# Patient Record
Sex: Female | Born: 1935 | Race: Black or African American | Hispanic: No | Smoking: Never smoker
Health system: Southern US, Community
[De-identification: ages and names within clinical notes are randomized; demographics above are authoritative.]

## PROBLEM LIST (undated history)

## (undated) DIAGNOSIS — R7303 Prediabetes: Secondary | ICD-10-CM

## (undated) DIAGNOSIS — D352 Benign neoplasm of pituitary gland: Secondary | ICD-10-CM

## (undated) DIAGNOSIS — D649 Anemia, unspecified: Secondary | ICD-10-CM

## (undated) DIAGNOSIS — M1990 Unspecified osteoarthritis, unspecified site: Secondary | ICD-10-CM

## (undated) DIAGNOSIS — E079 Disorder of thyroid, unspecified: Secondary | ICD-10-CM

## (undated) DIAGNOSIS — C50919 Malignant neoplasm of unspecified site of unspecified female breast: Secondary | ICD-10-CM

## (undated) DIAGNOSIS — E039 Hypothyroidism, unspecified: Secondary | ICD-10-CM

## (undated) HISTORY — PX: CATARACT EXTRACTION: SUR2

## (undated) HISTORY — PX: DILATION AND CURETTAGE OF UTERUS: SHX78

## (undated) HISTORY — PX: APPENDECTOMY: SHX54

## (undated) HISTORY — DX: Disorder of thyroid, unspecified: E07.9

## (undated) HISTORY — PX: EYE SURGERY: SHX253

## (undated) HISTORY — PX: HAMMER TOE SURGERY: SHX385

## (undated) HISTORY — PX: TUBAL LIGATION: SHX77

## (undated) HISTORY — DX: Malignant neoplasm of unspecified site of unspecified female breast: C50.919

## (undated) HISTORY — DX: Benign neoplasm of pituitary gland: D35.2

## (undated) HISTORY — DX: Unspecified osteoarthritis, unspecified site: M19.90

## (undated) HISTORY — PX: OTHER SURGICAL HISTORY: SHX169

---

## 2003-03-15 DIAGNOSIS — H409 Unspecified glaucoma: Secondary | ICD-10-CM | POA: Insufficient documentation

## 2003-05-31 ENCOUNTER — Other Ambulatory Visit: Admission: RE | Admit: 2003-05-31 | Discharge: 2003-05-31 | Payer: Self-pay | Admitting: *Deleted

## 2004-09-13 ENCOUNTER — Encounter: Admission: RE | Admit: 2004-09-13 | Discharge: 2004-09-13 | Payer: Self-pay | Admitting: Obstetrics and Gynecology

## 2005-07-16 ENCOUNTER — Other Ambulatory Visit: Admission: RE | Admit: 2005-07-16 | Discharge: 2005-07-16 | Payer: Self-pay | Admitting: Obstetrics and Gynecology

## 2005-09-16 ENCOUNTER — Encounter: Admission: RE | Admit: 2005-09-16 | Discharge: 2005-09-16 | Payer: Self-pay | Admitting: Obstetrics and Gynecology

## 2006-09-21 ENCOUNTER — Encounter: Admission: RE | Admit: 2006-09-21 | Discharge: 2006-09-21 | Payer: Self-pay | Admitting: Obstetrics and Gynecology

## 2006-10-01 ENCOUNTER — Encounter: Admission: RE | Admit: 2006-10-01 | Discharge: 2006-10-01 | Payer: Self-pay | Admitting: Obstetrics and Gynecology

## 2007-10-22 ENCOUNTER — Encounter: Admission: RE | Admit: 2007-10-22 | Discharge: 2007-10-22 | Payer: Self-pay | Admitting: Obstetrics and Gynecology

## 2008-08-24 ENCOUNTER — Encounter: Admission: RE | Admit: 2008-08-24 | Discharge: 2008-08-24 | Payer: Self-pay | Admitting: Obstetrics and Gynecology

## 2008-10-24 ENCOUNTER — Encounter: Admission: RE | Admit: 2008-10-24 | Discharge: 2008-10-24 | Payer: Self-pay | Admitting: Obstetrics and Gynecology

## 2009-10-30 ENCOUNTER — Encounter: Admission: RE | Admit: 2009-10-30 | Discharge: 2009-10-30 | Payer: Self-pay | Admitting: Obstetrics and Gynecology

## 2010-08-04 ENCOUNTER — Encounter: Payer: Self-pay | Admitting: Obstetrics and Gynecology

## 2010-10-01 ENCOUNTER — Other Ambulatory Visit (HOSPITAL_BASED_OUTPATIENT_CLINIC_OR_DEPARTMENT_OTHER): Payer: Self-pay | Admitting: Family Medicine

## 2010-10-01 DIAGNOSIS — Z1239 Encounter for other screening for malignant neoplasm of breast: Secondary | ICD-10-CM

## 2010-11-01 ENCOUNTER — Ambulatory Visit (HOSPITAL_BASED_OUTPATIENT_CLINIC_OR_DEPARTMENT_OTHER): Payer: Self-pay

## 2010-11-01 ENCOUNTER — Ambulatory Visit (HOSPITAL_BASED_OUTPATIENT_CLINIC_OR_DEPARTMENT_OTHER)
Admission: RE | Admit: 2010-11-01 | Discharge: 2010-11-01 | Disposition: A | Discharge status: Home / Self Care | Payer: Medicare Other | Source: Ambulatory Visit | Attending: Family Medicine | Admitting: Family Medicine

## 2010-11-01 DIAGNOSIS — Z1239 Encounter for other screening for malignant neoplasm of breast: Secondary | ICD-10-CM

## 2010-11-01 DIAGNOSIS — Z1231 Encounter for screening mammogram for malignant neoplasm of breast: Secondary | ICD-10-CM | POA: Insufficient documentation

## 2011-03-25 ENCOUNTER — Ambulatory Visit: Payer: Medicare Other | Attending: Orthopedic Surgery | Admitting: Physical Therapy

## 2011-03-25 DIAGNOSIS — M545 Low back pain, unspecified: Secondary | ICD-10-CM | POA: Insufficient documentation

## 2011-03-25 DIAGNOSIS — IMO0001 Reserved for inherently not codable concepts without codable children: Secondary | ICD-10-CM | POA: Insufficient documentation

## 2011-04-01 ENCOUNTER — Ambulatory Visit: Payer: Medicare Other | Admitting: Physical Therapy

## 2011-04-03 ENCOUNTER — Ambulatory Visit: Payer: Medicare Other | Admitting: Physical Therapy

## 2011-04-09 ENCOUNTER — Ambulatory Visit: Payer: Medicare Other | Admitting: Physical Therapy

## 2011-04-14 ENCOUNTER — Ambulatory Visit: Payer: Medicare Other | Attending: Orthopedic Surgery | Admitting: Physical Therapy

## 2011-04-14 DIAGNOSIS — IMO0001 Reserved for inherently not codable concepts without codable children: Secondary | ICD-10-CM | POA: Insufficient documentation

## 2011-04-14 DIAGNOSIS — M545 Low back pain, unspecified: Secondary | ICD-10-CM | POA: Insufficient documentation

## 2011-04-16 ENCOUNTER — Ambulatory Visit: Payer: Medicare Other | Admitting: Physical Therapy

## 2011-04-21 ENCOUNTER — Ambulatory Visit: Payer: Medicare Other | Admitting: Physical Therapy

## 2011-04-23 ENCOUNTER — Ambulatory Visit: Payer: Medicare Other | Admitting: Physical Therapy

## 2011-09-23 ENCOUNTER — Other Ambulatory Visit (HOSPITAL_BASED_OUTPATIENT_CLINIC_OR_DEPARTMENT_OTHER): Payer: Self-pay | Admitting: Internal Medicine

## 2011-09-23 DIAGNOSIS — Z1231 Encounter for screening mammogram for malignant neoplasm of breast: Secondary | ICD-10-CM

## 2011-11-04 ENCOUNTER — Ambulatory Visit (HOSPITAL_BASED_OUTPATIENT_CLINIC_OR_DEPARTMENT_OTHER)
Admission: RE | Admit: 2011-11-04 | Discharge: 2011-11-04 | Disposition: A | Discharge status: Home / Self Care | Payer: Medicare Other | Source: Ambulatory Visit | Attending: Internal Medicine | Admitting: Internal Medicine

## 2011-11-04 DIAGNOSIS — Z1231 Encounter for screening mammogram for malignant neoplasm of breast: Secondary | ICD-10-CM

## 2012-08-09 ENCOUNTER — Other Ambulatory Visit: Payer: Self-pay | Admitting: Otolaryngology

## 2012-08-09 DIAGNOSIS — J3489 Other specified disorders of nose and nasal sinuses: Secondary | ICD-10-CM

## 2012-08-09 DIAGNOSIS — J329 Chronic sinusitis, unspecified: Secondary | ICD-10-CM

## 2012-08-09 DIAGNOSIS — R209 Unspecified disturbances of skin sensation: Secondary | ICD-10-CM

## 2012-08-10 ENCOUNTER — Ambulatory Visit
Admission: RE | Admit: 2012-08-10 | Discharge: 2012-08-10 | Disposition: A | Discharge status: Home / Self Care | Payer: Medicare Other | Source: Ambulatory Visit | Attending: Otolaryngology | Admitting: Otolaryngology

## 2012-08-10 DIAGNOSIS — J3489 Other specified disorders of nose and nasal sinuses: Secondary | ICD-10-CM

## 2012-08-10 DIAGNOSIS — J329 Chronic sinusitis, unspecified: Secondary | ICD-10-CM

## 2012-08-10 DIAGNOSIS — R209 Unspecified disturbances of skin sensation: Secondary | ICD-10-CM

## 2012-10-05 ENCOUNTER — Ambulatory Visit (INDEPENDENT_AMBULATORY_CARE_PROVIDER_SITE_OTHER): Payer: Medicare Other | Admitting: Neurology

## 2012-10-05 ENCOUNTER — Encounter: Payer: Self-pay | Admitting: Neurology

## 2012-10-05 VITALS — BP 135/77 | HR 77 | Ht 64.0 in | Wt 190.0 lb

## 2012-10-05 DIAGNOSIS — R519 Headache, unspecified: Secondary | ICD-10-CM

## 2012-10-05 DIAGNOSIS — D352 Benign neoplasm of pituitary gland: Secondary | ICD-10-CM

## 2012-10-05 DIAGNOSIS — R51 Headache: Secondary | ICD-10-CM

## 2012-10-05 HISTORY — DX: Benign neoplasm of pituitary gland: D35.2

## 2012-10-05 NOTE — Patient Instructions (Addendum)
Call office in March 2015,before next appointment for MRI pituitary w/wo contrast.

## 2012-10-05 NOTE — Progress Notes (Signed)
HPI: [name] is a 78 years old right-handed African American female, referred by her primary care physician Dr. Tiburcio Pea, ENT Dr. Melvenia Beam for evaluation of left facial pain  She had a past medical history of chickenpox, also had a history of shingles vaccination 10 years ago, in December 2013, she went to the funeral of her sister at [place], staying outside in [place] weather for a few hours, often worse, she began to notice left facial deep achy pain, involving left lower eyelid, left cheek region, especially left nasal labial fold region, left upper lip, there was no rash , severe pain last about few weeks, she began to notice hyposensitivity in the similar region, also  stuffiness on her left nose,  She was treated with a few runs of antibiotics, nasal endoscopy examination by Dr. Emeline Darling was normal, CT sinus was normal as well  UPDATE March 25th 2014: MRI brain showed hypo-enhancing lesion in the left pituitary gland (10x13mm on axial views). Likely represents pituitary microadenoma. Mild perisylvian atrophy.  No abnormalities of the trigeminal nerve Her left facial stinging pain sensation has disappeared, gabapentin 100 mg 3 times a day help her some, she has numbness involving left cheek, left upper lip, left nasal labia fold, she denies visual difficulty, no swallowing difficulty, no dysarthria she denied gait difficulty,  Review of Systems  Out of a complete 14 system review, the patient complains of only the following symptoms, and all other reviewed systems are negative.  Neurological: Numbness       Cancer Heart disease   Physical Exam  Neck: supple no carotid bruits Respiratory: clear to auscultation bilaterally Cardiovascular: regular rate rhythm  Neurologic Exam  Mental Status: pleasant, awake, alert, cooperative to history, talking, and casual conversation. Cranial Nerves: CN II-XII pupils were equal round reactive to light.  Fundi were sharp bilaterally.  Extraocular  movements were full.  Visual fields were full on confrontational test.  Facial sensation and strength were normal.  Hearing was intact to finger rubbing bilaterally.  Uvula tongue were midline.  Head turning and shoulder shrugging were normal and symmetric.  Tongue protrusion into the cheeks strength were normal.  Motor: Normal tone, bulk, and strength. Sensory: Normal to light touch, pinprick, proprioception, and vibratory sensation.with the exception of hypersensitivity at left V2 distribution, involving left upper lip, left nasal area, bilateral corneal reflexes were normal and symmetric Coordination: Normal finger-to-nose, heel-to-shin.  There was no dysmetria noticed. Gait and Station: Narrow based and steady, was able to perform tiptoe, heel, and tandem walking without difficulty.  Romberg sign: Negative Reflexes: Deep tendon reflexes: Biceps: 2/2, Brachioradialis: 2/2, Triceps: 2/2, Pateller: 2/2, Achilles: 2/2.  Plantar responses are flexor.   Assessment and Plan: 78 years old [place] American female, with past medical history of chickenpox, previously shingles vaccination, presenting with left facial pain, hypersensitivity, in the territory of left V2 distribution,   1, most likely mild form of  left V2 shingle 2. Microadenoma of pituitary gland on MRI, 3 check TSH 4.Return to clinic with Eber Jones in one year

## 2012-10-06 ENCOUNTER — Other Ambulatory Visit: Payer: Self-pay | Admitting: Obstetrics and Gynecology

## 2012-10-06 DIAGNOSIS — Z803 Family history of malignant neoplasm of breast: Secondary | ICD-10-CM

## 2012-10-06 LAB — TSH: TSH: 3.04 u[IU]/mL (ref 0.450–4.500)

## 2012-10-07 ENCOUNTER — Other Ambulatory Visit (HOSPITAL_BASED_OUTPATIENT_CLINIC_OR_DEPARTMENT_OTHER): Payer: Self-pay | Admitting: Family Medicine

## 2012-10-07 DIAGNOSIS — Z1239 Encounter for other screening for malignant neoplasm of breast: Secondary | ICD-10-CM

## 2012-10-14 ENCOUNTER — Telehealth: Payer: Self-pay

## 2012-10-14 NOTE — Telephone Encounter (Signed)
Called patient and left her a message with Normal labs.

## 2012-11-05 ENCOUNTER — Ambulatory Visit (HOSPITAL_BASED_OUTPATIENT_CLINIC_OR_DEPARTMENT_OTHER)
Admission: RE | Admit: 2012-11-05 | Discharge: 2012-11-05 | Disposition: A | Discharge status: Home / Self Care | Payer: Medicare Other | Source: Ambulatory Visit | Attending: Family Medicine | Admitting: Family Medicine

## 2012-11-05 DIAGNOSIS — Z1231 Encounter for screening mammogram for malignant neoplasm of breast: Secondary | ICD-10-CM | POA: Insufficient documentation

## 2012-11-05 DIAGNOSIS — Z1239 Encounter for other screening for malignant neoplasm of breast: Secondary | ICD-10-CM

## 2013-10-03 ENCOUNTER — Other Ambulatory Visit (HOSPITAL_BASED_OUTPATIENT_CLINIC_OR_DEPARTMENT_OTHER): Payer: Self-pay | Admitting: Family Medicine

## 2013-10-03 DIAGNOSIS — Z1231 Encounter for screening mammogram for malignant neoplasm of breast: Secondary | ICD-10-CM

## 2013-11-14 ENCOUNTER — Ambulatory Visit (HOSPITAL_BASED_OUTPATIENT_CLINIC_OR_DEPARTMENT_OTHER): Payer: Medicare Other

## 2013-11-18 ENCOUNTER — Ambulatory Visit (HOSPITAL_BASED_OUTPATIENT_CLINIC_OR_DEPARTMENT_OTHER)
Admission: RE | Admit: 2013-11-18 | Discharge: 2013-11-18 | Disposition: A | Discharge status: Home / Self Care | Payer: Medicare Other | Source: Ambulatory Visit | Attending: Family Medicine | Admitting: Family Medicine

## 2013-11-18 DIAGNOSIS — Z1231 Encounter for screening mammogram for malignant neoplasm of breast: Secondary | ICD-10-CM | POA: Insufficient documentation

## 2014-10-16 ENCOUNTER — Other Ambulatory Visit (HOSPITAL_BASED_OUTPATIENT_CLINIC_OR_DEPARTMENT_OTHER): Payer: Self-pay | Admitting: Family Medicine

## 2014-10-16 DIAGNOSIS — Z1231 Encounter for screening mammogram for malignant neoplasm of breast: Secondary | ICD-10-CM

## 2014-11-24 ENCOUNTER — Ambulatory Visit (HOSPITAL_BASED_OUTPATIENT_CLINIC_OR_DEPARTMENT_OTHER)
Admission: RE | Admit: 2014-11-24 | Discharge: 2014-11-24 | Disposition: A | Discharge status: Home / Self Care | Payer: Medicare Other | Source: Ambulatory Visit | Attending: Family Medicine | Admitting: Family Medicine

## 2014-11-24 DIAGNOSIS — Z1231 Encounter for screening mammogram for malignant neoplasm of breast: Secondary | ICD-10-CM | POA: Diagnosis not present

## 2015-10-24 ENCOUNTER — Other Ambulatory Visit (HOSPITAL_BASED_OUTPATIENT_CLINIC_OR_DEPARTMENT_OTHER): Payer: Self-pay | Admitting: Family Medicine

## 2015-10-24 DIAGNOSIS — Z1231 Encounter for screening mammogram for malignant neoplasm of breast: Secondary | ICD-10-CM

## 2015-10-30 DIAGNOSIS — M2041 Other hammer toe(s) (acquired), right foot: Secondary | ICD-10-CM | POA: Insufficient documentation

## 2015-11-26 ENCOUNTER — Ambulatory Visit (HOSPITAL_BASED_OUTPATIENT_CLINIC_OR_DEPARTMENT_OTHER)
Admission: RE | Admit: 2015-11-26 | Discharge: 2015-11-26 | Disposition: A | Discharge status: Home / Self Care | Payer: Medicare Other | Source: Ambulatory Visit | Attending: Family Medicine | Admitting: Family Medicine

## 2015-11-26 DIAGNOSIS — Z1231 Encounter for screening mammogram for malignant neoplasm of breast: Secondary | ICD-10-CM | POA: Insufficient documentation

## 2016-10-20 ENCOUNTER — Other Ambulatory Visit (HOSPITAL_BASED_OUTPATIENT_CLINIC_OR_DEPARTMENT_OTHER): Payer: Self-pay | Admitting: Family Medicine

## 2016-10-20 DIAGNOSIS — Z1231 Encounter for screening mammogram for malignant neoplasm of breast: Secondary | ICD-10-CM

## 2016-11-27 ENCOUNTER — Ambulatory Visit (HOSPITAL_BASED_OUTPATIENT_CLINIC_OR_DEPARTMENT_OTHER)
Admission: RE | Admit: 2016-11-27 | Discharge: 2016-11-27 | Disposition: A | Discharge status: Home / Self Care | Payer: Medicare Other | Source: Ambulatory Visit | Attending: Family Medicine | Admitting: Family Medicine

## 2016-11-27 DIAGNOSIS — Z1231 Encounter for screening mammogram for malignant neoplasm of breast: Secondary | ICD-10-CM | POA: Diagnosis present

## 2017-10-19 ENCOUNTER — Other Ambulatory Visit (HOSPITAL_BASED_OUTPATIENT_CLINIC_OR_DEPARTMENT_OTHER): Payer: Self-pay | Admitting: Family Medicine

## 2017-10-19 DIAGNOSIS — Z1231 Encounter for screening mammogram for malignant neoplasm of breast: Secondary | ICD-10-CM

## 2017-11-27 ENCOUNTER — Ambulatory Visit (HOSPITAL_BASED_OUTPATIENT_CLINIC_OR_DEPARTMENT_OTHER): Payer: Federal, State, Local not specified - PPO

## 2017-11-27 ENCOUNTER — Ambulatory Visit (HOSPITAL_BASED_OUTPATIENT_CLINIC_OR_DEPARTMENT_OTHER)
Admission: RE | Admit: 2017-11-27 | Discharge: 2017-11-27 | Disposition: A | Discharge status: Home / Self Care | Payer: Medicare Other | Source: Ambulatory Visit | Attending: Family Medicine | Admitting: Family Medicine

## 2017-11-27 DIAGNOSIS — Z1231 Encounter for screening mammogram for malignant neoplasm of breast: Secondary | ICD-10-CM | POA: Diagnosis present

## 2018-10-18 ENCOUNTER — Other Ambulatory Visit (HOSPITAL_BASED_OUTPATIENT_CLINIC_OR_DEPARTMENT_OTHER): Payer: Self-pay | Admitting: Family Medicine

## 2018-10-18 DIAGNOSIS — Z1231 Encounter for screening mammogram for malignant neoplasm of breast: Secondary | ICD-10-CM

## 2018-11-30 ENCOUNTER — Ambulatory Visit (HOSPITAL_BASED_OUTPATIENT_CLINIC_OR_DEPARTMENT_OTHER)
Admission: RE | Admit: 2018-11-30 | Discharge: 2018-11-30 | Disposition: A | Discharge status: Home / Self Care | Payer: Medicare Other | Source: Ambulatory Visit | Attending: Family Medicine | Admitting: Family Medicine

## 2018-11-30 ENCOUNTER — Ambulatory Visit (HOSPITAL_BASED_OUTPATIENT_CLINIC_OR_DEPARTMENT_OTHER): Payer: Federal, State, Local not specified - PPO

## 2018-11-30 ENCOUNTER — Other Ambulatory Visit: Payer: Self-pay

## 2018-11-30 DIAGNOSIS — Z1231 Encounter for screening mammogram for malignant neoplasm of breast: Secondary | ICD-10-CM | POA: Insufficient documentation

## 2019-11-07 ENCOUNTER — Other Ambulatory Visit (HOSPITAL_BASED_OUTPATIENT_CLINIC_OR_DEPARTMENT_OTHER): Payer: Self-pay | Admitting: Family Medicine

## 2019-11-07 DIAGNOSIS — Z1231 Encounter for screening mammogram for malignant neoplasm of breast: Secondary | ICD-10-CM

## 2019-12-02 ENCOUNTER — Ambulatory Visit (HOSPITAL_BASED_OUTPATIENT_CLINIC_OR_DEPARTMENT_OTHER): Payer: Federal, State, Local not specified - PPO

## 2019-12-05 ENCOUNTER — Ambulatory Visit (HOSPITAL_BASED_OUTPATIENT_CLINIC_OR_DEPARTMENT_OTHER)
Admission: RE | Admit: 2019-12-05 | Discharge: 2019-12-05 | Disposition: A | Discharge status: Home / Self Care | Payer: Medicare Other | Source: Ambulatory Visit | Attending: Family Medicine | Admitting: Family Medicine

## 2019-12-05 ENCOUNTER — Other Ambulatory Visit: Payer: Self-pay

## 2019-12-05 DIAGNOSIS — Z1231 Encounter for screening mammogram for malignant neoplasm of breast: Secondary | ICD-10-CM | POA: Diagnosis present

## 2020-06-14 DIAGNOSIS — M76821 Posterior tibial tendinitis, right leg: Secondary | ICD-10-CM | POA: Insufficient documentation

## 2020-09-11 DIAGNOSIS — M069 Rheumatoid arthritis, unspecified: Secondary | ICD-10-CM | POA: Insufficient documentation

## 2020-11-26 ENCOUNTER — Other Ambulatory Visit (HOSPITAL_BASED_OUTPATIENT_CLINIC_OR_DEPARTMENT_OTHER): Payer: Self-pay | Admitting: Family Medicine

## 2020-11-26 DIAGNOSIS — Z1231 Encounter for screening mammogram for malignant neoplasm of breast: Secondary | ICD-10-CM

## 2020-12-11 ENCOUNTER — Ambulatory Visit (HOSPITAL_BASED_OUTPATIENT_CLINIC_OR_DEPARTMENT_OTHER): Payer: Federal, State, Local not specified - PPO

## 2020-12-11 ENCOUNTER — Encounter (HOSPITAL_BASED_OUTPATIENT_CLINIC_OR_DEPARTMENT_OTHER): Payer: Self-pay

## 2020-12-11 ENCOUNTER — Other Ambulatory Visit: Payer: Self-pay

## 2020-12-11 ENCOUNTER — Ambulatory Visit (HOSPITAL_BASED_OUTPATIENT_CLINIC_OR_DEPARTMENT_OTHER)
Admission: RE | Admit: 2020-12-11 | Discharge: 2020-12-11 | Disposition: A | Discharge status: Home / Self Care | Payer: Medicare Other | Source: Ambulatory Visit | Attending: Family Medicine | Admitting: Family Medicine

## 2020-12-11 DIAGNOSIS — Z1231 Encounter for screening mammogram for malignant neoplasm of breast: Secondary | ICD-10-CM | POA: Insufficient documentation

## 2020-12-12 ENCOUNTER — Other Ambulatory Visit: Payer: Self-pay | Admitting: Family Medicine

## 2020-12-12 DIAGNOSIS — R928 Other abnormal and inconclusive findings on diagnostic imaging of breast: Secondary | ICD-10-CM

## 2021-01-03 ENCOUNTER — Other Ambulatory Visit: Payer: Self-pay

## 2021-01-03 ENCOUNTER — Ambulatory Visit
Admission: RE | Admit: 2021-01-03 | Discharge: 2021-01-03 | Disposition: A | Discharge status: Home / Self Care | Payer: Medicare Other | Source: Ambulatory Visit | Attending: Family Medicine | Admitting: Family Medicine

## 2021-01-03 ENCOUNTER — Other Ambulatory Visit: Payer: Self-pay | Admitting: Family Medicine

## 2021-01-03 DIAGNOSIS — R928 Other abnormal and inconclusive findings on diagnostic imaging of breast: Secondary | ICD-10-CM

## 2021-01-03 DIAGNOSIS — N6489 Other specified disorders of breast: Secondary | ICD-10-CM

## 2021-01-04 ENCOUNTER — Ambulatory Visit
Admission: RE | Admit: 2021-01-04 | Discharge: 2021-01-04 | Disposition: A | Discharge status: Home / Self Care | Payer: Medicare Other | Source: Ambulatory Visit | Attending: Family Medicine | Admitting: Family Medicine

## 2021-01-04 DIAGNOSIS — N6489 Other specified disorders of breast: Secondary | ICD-10-CM

## 2021-01-04 HISTORY — PX: BREAST BIOPSY: SHX20

## 2021-01-09 ENCOUNTER — Telehealth: Payer: Self-pay | Admitting: Oncology

## 2021-01-09 NOTE — Telephone Encounter (Signed)
Spoke to patient to confirm morning clinic appointment for 7/6, will email and mail packet to patient

## 2021-01-10 ENCOUNTER — Encounter: Payer: Self-pay | Admitting: *Deleted

## 2021-01-10 DIAGNOSIS — D0511 Intraductal carcinoma in situ of right breast: Secondary | ICD-10-CM | POA: Insufficient documentation

## 2021-01-15 NOTE — Progress Notes (Signed)
[facility]  Telephone:([phone]) [phone] Fax:(336) [phone]     ID: Nickole Adamek DOB: Oct 22, 1934  MR#: [id number]  ZHY#:[id number]  Patient Care Team: Shirline Frees, MD as PCP - General (Family Medicine) Rockwell Germany, RN as Oncology Nurse Navigator Mauro Kaufmann, RN as Oncology Nurse Navigator Masayuki Sakai, Virgie Dad, MD as Consulting Physician (Oncology) Stark Klein, MD as Consulting Physician (General Surgery) Kyung Rudd, MD as Consulting Physician (Radiation Oncology) Marylynn Pearson, MD as Consulting Physician (Obstetrics and Gynecology) Dial, Sherrie George, DPM as Referring Physician (Podiatry) Lonia Skinner, MD as Consulting Physician (Ophthalmology) Darrick Huntsman, MD as Referring Physician (Orthopedic Surgery) Sydnee Levans, MD as Referring Physician (Dermatology) Chauncey Cruel, MD OTHER MD:  CHIEF COMPLAINT: Estrogen receptor positive noninvasive breast cancer  CURRENT TREATMENT: Awaiting definitive treatment   HISTORY OF CURRENT ILLNESS: Arita Severtson had routine screening mammography on 12/11/2020 showing a possible abnormality in the right breast. She underwent right diagnostic mammography with tomography and right breast ultrasonography at [facility] on 01/03/2021 showing: breast density category C; persistent focal distortion within superior right breast without sonographic correlate.  Accordingly on 01/04/2021 she proceeded to biopsy of the right breast area in question. The pathology from this procedure ([id number]) showed: ductal carcinoma in situ with calcifications involving a complex sclerosing lesion. Prognostic indicators significant for: estrogen receptor, 95% positive and progesterone receptor, 1% positive, both with strong staining intensity.   Cancer Staging Ductal carcinoma in situ (DCIS) of right breast Staging form: Breast, AJCC 8th Edition - Clinical stage from 01/16/2021: Stage 0 (cTis (DCIS), cN0, cM0, ER+, PR+) -  Signed by Chauncey Cruel, MD on 01/16/2021 Stage prefix: Initial diagnosis Nuclear grade: G3  The patient's subsequent history is as detailed below.   INTERVAL HISTORY: Voula was evaluated in the multidisciplinary breast cancer clinic on 01/16/2021 accompanied by her daughter Tillie Rung. Her case was also presented at the multidisciplinary breast cancer conference on the same day. At that time a preliminary plan was proposed: Breast conserving surgery and antiestrogens, genetics testing, likely no radiation   REVIEW OF SYSTEMS: There were no specific symptoms leading to the original mammogram, which was routinely scheduled. On the provided questionnaire, Jnai reports cramping, wearing glasses, sinus problems, feet swelling, abnormal moles, arthritis, and thyroid problem. The patient denies unusual headaches, visual changes, nausea, vomiting, stiff neck, dizziness, or gait imbalance. There has been no cough, phlegm production, or pleurisy, no chest pain or pressure, and no change in bowel or bladder habits. The patient denies fever, rash, bleeding, unexplained fatigue or unexplained weight loss. A detailed review of systems was otherwise entirely negative.   COVID 19 VACCINATION STATUS: Pfizer x3   PAST MEDICAL HISTORY: Past Medical History:  Diagnosis Date   Arthritis    Breast cancer ([place])    Pituitary adenoma ([place]) 10/05/2012   Thyroid disease     PAST SURGICAL HISTORY: Past Surgical History:  Procedure Laterality Date   CATARACT EXTRACTION     mole removed  2013 and 2006   TUBAL LIGATION      FAMILY HISTORY: Family History  Problem Relation Age of Onset   Breast cancer Sister    Pancreatic cancer Sister    Breast cancer Sister    Lung cancer Sister    Prostate cancer Brother    Stomach cancer Paternal Grandmother    Her father died at age 23 from cirrhosis (alcohol-induced). Her mother died at age 48. Dennie has two brothers and four sisters. She reports breast cancer  in  two sisters, one at age 46, who was also diagnosed with pancreatic cancer, and the other at age 77. She also reports lung cancer in a sister at age 24, prostate cancer in a brother at age 72, and stomach cancer in her paternal grandmother.   GYNECOLOGIC HISTORY:  No LMP recorded. Patient is postmenopausal. Menarche: 86 years old Age at first live birth: 86 years old [place] P 2 LMP 1995 Contraceptive never used HRT never used  Hysterectomy? no BSO? 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 Husband Edison Nasuti is a retired Librarian, academic. At home is just the two of them. Daughter Gilberto Better, age 15, is a group Gaffer here in [place]. Son Marylyn Ishihara, age 27, is a Curator in [place], [place]. Guelda has three grandchildren, aged 23, 69, and 18. She has no great-grandchildren. She attends a Radi[employer], therapeutic church.    ADVANCED DIRECTIVES: in place   HEALTH MAINTENANCE: Social History   Tobacco Use   Smoking status: Never   Smokeless tobacco: Never  Substance Use Topics   Alcohol use: Yes    Comment: 1 month   Drug use: No     Colonoscopy: 2012 (with [facility])  PAP: 2017  Bone density: 12/2019   No Known Allergies  Current Outpatient Medications  Medication Sig Dispense Refill   BIOTIN PO biotin     brimonidine (ALPHAGAN P) 0.1 % SOLN      ELDERBERRY PO Take by mouth.     fish oil-omega-3 fatty acids 1000 MG capsule Take 2 g by mouth daily.     Multiple Vitamins-Minerals (MULTIVITAMIN PO) Take by mouth daily.     Multiple Vitamins-Minerals (ZINC PO) Take by mouth.     Nutritional Supplements (GLUCOSAMINE COMPLEX PO) Take by mouth.     travoprost, benzalkonium, (TRAVATAN) 0.004 % ophthalmic solution 1 drop at bedtime.     Turmeric (QC TUMERIC COMPLEX PO) Take by mouth.     Cholecalciferol (D3 ADULT PO) Take 1 tablet by mouth daily.     No current facility-administered medications for this  visit.    OBJECTIVE: African-American woman who appears younger than stated age  12:   01/16/21 0842  BP: (!) 148/70  Pulse: 65  Resp: 18  Temp: 97.7 F (36.5 C)  SpO2: 100%     Body mass index is 28.46 kg/m.   Wt Readings from Last 3 Encounters:  01/16/21 165 lb 12.8 oz (75.2 kg)  10/05/12 190 lb (86.2 kg)      ECOG FS:1 - Symptomatic but completely ambulatory  Ocular: Sclerae unicteric, pupils round and equal Ear-nose-throat: Wearing a mask Lymphatic: No cervical or supraclavicular adenopathy Lungs no rales or rhonchi Heart regular rate and rhythm Abd soft, nontender, positive bowel sounds MSK no focal spinal tenderness, no joint edema Neuro: non-focal, well-oriented, appropriate affect Breasts: The right breast is status post recent biopsy.  There is no palpable mass.  The left breast and both axillae are benign.   LAB RESULTS:  CMP     Component Value Date/Time   NA 141 01/16/2021 0819   K 3.8 01/16/2021 0819   CL 108 01/16/2021 0819   CO2 27 01/16/2021 0819   GLUCOSE 99 01/16/2021 0819   BUN 30 (H) 01/16/2021 0819   CREATININE 0.90 01/16/2021 0819   CALCIUM 9.4 01/16/2021 0819   PROT 6.8 01/16/2021 0819   ALBUMIN 3.2 (L) 01/16/2021 0819   AST 20 01/16/2021  0819   ALT 18 01/16/2021 0819   ALKPHOS 83 01/16/2021 0819   BILITOT 0.3 01/16/2021 0819   GFRNONAA >60 01/16/2021 0819    No results found for: TOTALPROTELP, ALBUMINELP, A1GS, A2GS, BETS, BETA2SER, GAMS, MSPIKE, SPEI  Lab Results  Component Value Date   WBC 4.4 01/16/2021   NEUTROABS 1.9 01/16/2021   HGB 12.1 01/16/2021   HCT 35.6 (L) 01/16/2021   MCV 87.3 01/16/2021   PLT 156 01/16/2021    No results found for: LABCA2  No components found for: ZYYQMG500  No results for input(s): INR in the last 168 hours.  No results found for: LABCA2  No results found for: BBC488  No results found for: QBV694  No results found for: HWT888  No results found for: CA2729  No components  found for: HGQUANT  No results found for: CEA1 / No results found for: CEA1   No results found for: AFPTUMOR  No results found for: CHROMOGRNA  No results found for: KPAFRELGTCHN, LAMBDASER, KAPLAMBRATIO (kappa/lambda light chains)  No results found for: HGBA, HGBA2QUANT, HGBFQUANT, HGBSQUAN (Hemoglobinopathy evaluation)   No results found for: LDH  No results found for: IRON, TIBC, IRONPCTSAT (Iron and TIBC)  No results found for: FERRITIN  Urinalysis No results found for: COLORURINE, APPEARANCEUR, LABSPEC, PHURINE, GLUCOSEU, HGBUR, BILIRUBINUR, KETONESUR, PROTEINUR, UROBILINOGEN, NITRITE, LEUKOCYTESUR   STUDIES: US BREAST LTD UNI RIGHT INC AXILLA  Result Date: 01/03/2021 CLINICAL DATA:  Patient recalled from screening for right breast distortion. EXAM: DIGITAL DIAGNOSTIC UNILATERAL RIGHT MAMMOGRAM WITH TOMOSYNTHESIS AND CAD; ULTRASOUND RIGHT BREAST LIMITED TECHNIQUE: Right digital diagnostic mammography and breast tomosynthesis was performed. The images were evaluated with computer-aided detection.; Targeted ultrasound examination of the right breast was performed COMPARISON:  Previous exam(s). ACR Breast Density Category c: The breast tissue is heterogeneously dense, which may obscure small masses. FINDINGS: Within the central slightly superior right breast middle depth there is a persistent focal area of architectural distortion which persists on spot compression cc views and rolled cc views. Targeted ultrasound is performed, showing no suspicious mass within the superior slightly lateral right breast. No right axillary adenopathy. IMPRESSION: Persistent focal distortion within the superior right breast middle depth without sonographic correlate. RECOMMENDATION: Stereotactic guided core needle biopsy right breast distortion. I have discussed the findings and recommendations with the patient. If applicable, a reminder letter will be sent to the patient regarding the next appointment.  BI-RADS CATEGORY  4: Suspicious. Electronically Signed   By: Lovey Newcomer M.D.   On: 01/03/2021 15:13  MM DIAG BREAST TOMO UNI RIGHT  Result Date: 01/03/2021 CLINICAL DATA:  Patient recalled from screening for right breast distortion. EXAM: DIGITAL DIAGNOSTIC UNILATERAL RIGHT MAMMOGRAM WITH TOMOSYNTHESIS AND CAD; ULTRASOUND RIGHT BREAST LIMITED TECHNIQUE: Right digital diagnostic mammography and breast tomosynthesis was performed. The images were evaluated with computer-aided detection.; Targeted ultrasound examination of the right breast was performed COMPARISON:  Previous exam(s). ACR Breast Density Category c: The breast tissue is heterogeneously dense, which may obscure small masses. FINDINGS: Within the central slightly superior right breast middle depth there is a persistent focal area of architectural distortion which persists on spot compression cc views and rolled cc views. Targeted ultrasound is performed, showing no suspicious mass within the superior slightly lateral right breast. No right axillary adenopathy. IMPRESSION: Persistent focal distortion within the superior right breast middle depth without sonographic correlate. RECOMMENDATION: Stereotactic guided core needle biopsy right breast distortion. I have discussed the findings and recommendations with the patient. If applicable, a reminder  letter will be sent to the patient regarding the next appointment. BI-RADS CATEGORY  4: Suspicious. Electronically Signed   By: Lovey Newcomer M.D.   On: 01/03/2021 15:13  MM CLIP PLACEMENT RIGHT  Result Date: 01/04/2021 CLINICAL DATA:  Status post stereotactic guided core biopsy of RIGHT breast distortion. EXAM: 3D DIAGNOSTIC RIGHT MAMMOGRAM POST STEREOTACTIC BIOPSY COMPARISON:  Previous exam(s). FINDINGS: 3D Mammographic images were obtained following stereotactic guided biopsy of distortion in the UPPER central RIGHT breast and placement of an X shaped clip. The biopsy marking clip is in expected position  at the site of biopsy. IMPRESSION: Appropriate positioning of the X shaped biopsy marking clip at the site of biopsy in the UPPER central RIGHT breast distortion. Final Assessment: Post Procedure Mammograms for Marker Placement Electronically Signed   By: Nolon Nations M.D.   On: 01/04/2021 12:07  MM RT BREAST BX W LOC DEV 1ST LESION IMAGE BX SPEC STEREO GUIDE  Addendum Date: 01/08/2021   ADDENDUM REPORT: 01/08/2021 14:32 ADDENDUM: Pathology revealed DUCTAL CARCINOMA IN SITU WITH CALCIFICATIONS INVOLVING A COMPLEX SCLEROSING LESION of the RIGHT breast, central, X clip. This was found to be concordant by Dr. Nolon Nations. Pathology results were discussed with the patient by telephone. The patient reported doing well after the biopsy with tenderness at the site. Post biopsy instructions and care were reviewed and questions were answered. The patient was encouraged to call [facility] for any additional concerns. The patient was referred to [facility] on January 16, 2021. Pathology results reported by Stacie Acres RN on 01/08/2021. Electronically Signed   By: Nolon Nations M.D.   On: 01/08/2021 14:32   Result Date: 01/08/2021 CLINICAL DATA:  Patient presents for stereotactic guided core biopsy of RIGHT breast distortion. EXAM: RIGHT BREAST STEREOTACTIC CORE NEEDLE BIOPSY COMPARISON:  Previous exams. FINDINGS: The patient and I discussed the procedure of stereotactic-guided biopsy including benefits and alternatives. We discussed the high likelihood of a successful procedure. We discussed the risks of the procedure including infection, bleeding, tissue injury, clip migration, and inadequate sampling. Informed written consent was given. The usual time out protocol was performed immediately prior to the procedure. Using sterile technique and 1% lidocaine and 1% lidocaine with epinephrine as local anesthetic,  under stereotactic guidance, a 9 gauge vacuum assisted device was used to perform core needle biopsy of distortion in the UPPER central RIGHT breast using a craniocaudal approach. Lesion quadrant: UPPER central RIGHT breast At the conclusion of the procedure, X shaped tissue marker clip was deployed into the biopsy cavity. Follow-up 2-view mammogram was performed and dictated separately. IMPRESSION: Stereotactic-guided biopsy of distortion in the RIGHT breast. No apparent complications. Electronically Signed: By: Nolon Nations M.D. On: 01/04/2021 12:06    ELIGIBLE FOR AVAILABLE RESEARCH PROTOCOL: no  ASSESSMENT: 86 y.o. [place] woman status post right breast biopsy 01/04/2021 for ductal carcinoma in situ, estrogen and progesterone receptor positive  (1) genetics testing  (2) definitive surgery pending  (3) anastrozole started 01/17/2021  PLAN: I met today with Lucyle to review her new diagnosis. Specifically we discussed the biology of her breast cancer, its diagnosis, staging, treatment  options and prognosis. Sayaka understands that in noninvasive ductal carcinoma, also called ductal carcinoma in situ ("DCIS") the breast cancer cells remain trapped in the ducts were they started. They cannot travel to a vital organ. For that reason these cancers in themselves are not life-threatening.  If the  whole breast is removed then all the ducts are removed and since the cancer cells are trapped in the ducts, the cure rate with mastectomy for noninvasive breast cancer is approximately 99%. Nevertheless we recommend lumpectomy, because there is no survival advantage to mastectomy and because the cosmetic result is generally superior with breast conservation.  Since the patient is keeping her breast, there will be some risk of recurrence. The recurrence can only be in the same breast since, again, the cells are trapped in the ducts. There is no connection from one breast to the other. The risk of local  recurrence is cut by more than half with radiation, which is standard in this situation.  In estrogen receptor positive cancers like Jaeleen's, anti-estrogens can also be considered. They will further reduce the risk of recurrence by one half. In addition anti-estrogens will lower the risk of a new breast cancer developing in either breast, also by one half. That risk otherwise approaches 1% per year.   Accordingly the overall plan is for surgery, followed by radiation, then a discussion of anti-estrogens.  The patient does qualify for genetics testing In patients who carry a deleterious mutation [for example in a  BRCA gene], the risk of a new breast cancer developing in the future may be sufficiently great that the patient may choose bilateral mastectomies. However if she wishes to keep her breasts in that situation it is safe to do so. That would require intensified screening, which generally means not only yearly mammography but a yearly breast MRI as well.   Nasiah has a good understanding of the overall plan. She agrees with it. She knows the goal of treatment in her case is cure. She will call with any problems that may develop before her next visit here.  Total encounter time 55 minutes.Sarajane Jews C. Nyron Mozer, MD 01/16/2021 6:23 PM Medical Oncology and Hematology [facility] [place], [place] [address] Tel. [phone]    Fax. [phone]   This document serves as a record of services personally performed by Lurline Del, MD. It was created on his behalf by Wilburn Mylar, a trained medical scribe. The creation of this record is based on the scribe's personal observations and the provider's statements to them.   I, Lurline Del MD, have reviewed the above documentation for accuracy and completeness, and I agree with the above.    *Total Encounter Time as defined by the Centers for Medicare and Medicaid Services includes, in addition to the  face-to-face time of a patient visit (documented in the note above) non-face-to-face time: obtaining and reviewing outside history, ordering and reviewing medications, tests or procedures, care coordination (communications with other health care professionals or caregivers) and documentation in the medical record.

## 2021-01-16 ENCOUNTER — Ambulatory Visit: Payer: Medicare Other | Admitting: Physical Therapy

## 2021-01-16 ENCOUNTER — Other Ambulatory Visit: Payer: Self-pay | Admitting: *Deleted

## 2021-01-16 ENCOUNTER — Other Ambulatory Visit: Payer: Self-pay

## 2021-01-16 ENCOUNTER — Encounter: Payer: Self-pay | Admitting: Licensed Clinical Social Worker

## 2021-01-16 ENCOUNTER — Encounter: Payer: Self-pay | Admitting: *Deleted

## 2021-01-16 ENCOUNTER — Other Ambulatory Visit: Payer: Self-pay | Admitting: General Surgery

## 2021-01-16 ENCOUNTER — Encounter: Payer: Medicare Other | Admitting: Genetic Counselor

## 2021-01-16 ENCOUNTER — Inpatient Hospital Stay: Payer: Medicare Other | Attending: Oncology | Admitting: Oncology

## 2021-01-16 ENCOUNTER — Ambulatory Visit
Admission: RE | Admit: 2021-01-16 | Discharge: 2021-01-16 | Disposition: A | Discharge status: Home / Self Care | Payer: Medicare Other | Source: Ambulatory Visit | Attending: Radiation Oncology | Admitting: Radiation Oncology

## 2021-01-16 ENCOUNTER — Encounter: Payer: Self-pay | Admitting: Oncology

## 2021-01-16 ENCOUNTER — Inpatient Hospital Stay: Payer: Medicare Other

## 2021-01-16 VITALS — BP 148/70 | HR 65 | Temp 97.7°F | Resp 18 | Ht 64.0 in | Wt 165.8 lb

## 2021-01-16 DIAGNOSIS — Z170 Estrogen receptor positive status [ER+]: Secondary | ICD-10-CM | POA: Insufficient documentation

## 2021-01-16 DIAGNOSIS — Z800 Family history of malignant neoplasm of digestive organs: Secondary | ICD-10-CM | POA: Diagnosis not present

## 2021-01-16 DIAGNOSIS — Z801 Family history of malignant neoplasm of trachea, bronchus and lung: Secondary | ICD-10-CM | POA: Insufficient documentation

## 2021-01-16 DIAGNOSIS — Z809 Family history of malignant neoplasm, unspecified: Secondary | ICD-10-CM | POA: Insufficient documentation

## 2021-01-16 DIAGNOSIS — C50411 Malignant neoplasm of upper-outer quadrant of right female breast: Secondary | ICD-10-CM

## 2021-01-16 DIAGNOSIS — D0511 Intraductal carcinoma in situ of right breast: Secondary | ICD-10-CM

## 2021-01-16 DIAGNOSIS — Z86018 Personal history of other benign neoplasm: Secondary | ICD-10-CM | POA: Diagnosis not present

## 2021-01-16 DIAGNOSIS — M129 Arthropathy, unspecified: Secondary | ICD-10-CM | POA: Insufficient documentation

## 2021-01-16 DIAGNOSIS — E079 Disorder of thyroid, unspecified: Secondary | ICD-10-CM | POA: Insufficient documentation

## 2021-01-16 DIAGNOSIS — Z8042 Family history of malignant neoplasm of prostate: Secondary | ICD-10-CM | POA: Diagnosis not present

## 2021-01-16 DIAGNOSIS — Z803 Family history of malignant neoplasm of breast: Secondary | ICD-10-CM | POA: Diagnosis not present

## 2021-01-16 DIAGNOSIS — Z79899 Other long term (current) drug therapy: Secondary | ICD-10-CM | POA: Insufficient documentation

## 2021-01-16 LAB — CMP (CANCER CENTER ONLY)
ALT: 18 U/L (ref 0–44)
AST: 20 U/L (ref 15–41)
Albumin: 3.2 g/dL — ABNORMAL LOW (ref 3.5–5.0)
Alkaline Phosphatase: 83 U/L (ref 38–126)
Anion gap: 6 (ref 5–15)
BUN: 30 mg/dL — ABNORMAL HIGH (ref 8–23)
CO2: 27 mmol/L (ref 22–32)
Calcium: 9.4 mg/dL (ref 8.9–10.3)
Chloride: 108 mmol/L (ref 98–111)
Creatinine: 0.9 mg/dL (ref 0.44–1.00)
GFR, Estimated: >60 mL/min (ref >60)
Glucose, Bld: 99 mg/dL (ref 70–99)
Potassium: 3.8 mmol/L (ref 3.5–5.1)
Sodium: 141 mmol/L (ref 135–145)
Total Bilirubin: 0.3 mg/dL (ref 0.3–1.2)
Total Protein: 6.8 g/dL (ref 6.5–8.1)

## 2021-01-16 LAB — CBC WITH DIFFERENTIAL (CANCER CENTER ONLY)
Abs Immature Granulocytes: 0.01 10*3/uL (ref 0.00–0.07)
Basophils Absolute: 0 10*3/uL (ref 0.0–0.1)
Basophils Relative: 1 %
Eosinophils Absolute: 0.1 10*3/uL (ref 0.0–0.5)
Eosinophils Relative: 2 %
HCT: 35.6 % — ABNORMAL LOW (ref 36.0–46.0)
Hemoglobin: 12.1 g/dL (ref 12.0–15.0)
Immature Granulocytes: 0 %
Lymphocytes Relative: 46 %
Lymphs Abs: 2.1 10*3/uL (ref 0.7–4.0)
MCH: 29.7 pg (ref 26.0–34.0)
MCHC: 34 g/dL (ref 30.0–36.0)
MCV: 87.3 fL (ref 80.0–100.0)
Monocytes Absolute: 0.3 10*3/uL (ref 0.1–1.0)
Monocytes Relative: 8 %
Neutro Abs: 1.9 10*3/uL (ref 1.7–7.7)
Neutrophils Relative %: 43 %
Platelet Count: 156 10*3/uL (ref 150–400)
RBC: 4.08 MIL/uL (ref 3.87–5.11)
RDW: 13.3 % (ref 11.5–15.5)
WBC Count: 4.4 10*3/uL (ref 4.0–10.5)
nRBC: 0 % (ref 0.0–0.2)

## 2021-01-16 LAB — GENETIC SCREENING ORDER

## 2021-01-16 MED ORDER — ANASTROZOLE 1 MG PO TABS: 1.0000 mg | ORAL_TABLET | Freq: Every day | ORAL | 4 refills | Status: DC

## 2021-01-16 NOTE — Progress Notes (Signed)
[facility] Work  Initial Assessment   Kelly Friedman is a 86 y.o. year old female accompanied by daughter, Tillie Rung. Clinical Social Work was referred by [facility] for assessment of psychosocial needs.   SDOH (Social Determinants of Health) assessments performed: Yes SDOH Interventions    Flowsheet Row Most Recent Value  SDOH Interventions   Food Insecurity Interventions Intervention Not Indicated  Financial Strain Interventions Intervention Not Indicated  Housing Interventions Intervention Not Indicated  Transportation Interventions Intervention Not Indicated       Distress Screen completed: Yes ONCBCN DISTRESS SCREENING 01/16/2021  Screening Type Initial Screening  Distress experienced in past week (1-10) 3  Information Concerns Type Lack of info about treatment;Lack of info about complementary therapy choices  Physical Problem type Swollen arms/legs      Family/Social Information:  Housing Arrangement: patient lives with husband Family members/support persons in your life? 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 Very involved in different ministries Medication Concerns: no  Services Currently in place:  n/a  Coping/ Adjustment to diagnosis: Patient understands treatment plan and what happens next? yes Concerns about diagnosis and/or treatment: I'm not especially worried about anything Patient reported stressors:  General adjustment Patient enjoys being outside, exercise, time with family/ friends, and involvement in faith community Current coping skills/ strengths: Capable of independent living, Armed forces logistics/support/administrative officer, Motivation for treatment/growth, Religious Affiliation, and Supportive  family/friends    SUMMARY: Current SDOH Barriers:  No significant barriers noted today  Clinical Social Work Clinical Goal(s):  No clinical SW goals at this time  Interventions: Discussed common feeling and emotions when being diagnosed with cancer, and the importance of support during treatment Informed patient of the support team roles and support services at [facility] Provided CSW contact information and encouraged patient to call with any questions or concerns   Follow Up Plan: Patient will contact CSW with any support or resource needs Patient verbalizes understanding of plan: Yes    Christeen Douglas , LCSW

## 2021-01-17 ENCOUNTER — Telehealth: Payer: Self-pay | Admitting: Oncology

## 2021-01-17 ENCOUNTER — Other Ambulatory Visit: Payer: Self-pay | Admitting: General Surgery

## 2021-01-17 ENCOUNTER — Telehealth: Payer: Self-pay | Admitting: *Deleted

## 2021-01-17 ENCOUNTER — Encounter: Payer: Self-pay | Admitting: *Deleted

## 2021-01-17 DIAGNOSIS — C50411 Malignant neoplasm of upper-outer quadrant of right female breast: Secondary | ICD-10-CM

## 2021-01-17 NOTE — Telephone Encounter (Signed)
Spoke to pt concerning [place] from 7.6.22. Denies questions or concerns regarding dx or treatment care plan. Encourage pt to call with needs. Received verbal understanding. Discussed seed placement appt

## 2021-01-17 NOTE — Telephone Encounter (Signed)
Sch per 7/7 los, pt aware 

## 2021-01-20 NOTE — Progress Notes (Signed)
Radiation Oncology         [phone] ________________________________  Name: [name] MRN: [id number]  Date: 01/16/2021  DOB: Dec 17, 1934  NI:OEVOJJ, Kelly Saxon, MD  Stark Klein, MD     REFERRING PHYSICIAN: Stark Klein, MD   DIAGNOSIS: The encounter diagnosis was Ductal carcinoma in situ (DCIS) of right breast.   HISTORY OF PRESENT ILLNESS::Kelly Friedman is a 86 y.o. female who is seen for an initial consultation visit regarding the patient's diagnosis of right-sided breast cancer.  The patient was found to have some suspicious calcifications within the right breast.  There was no ultrasound correlate and no axillary adenopathy was seen on this side.  Patient therefore proceeded with a biopsy which revealed a grade 2-3 DCIS.  Estrogen receptors were positive and progesterone receptor is positive.  The patient is felt to be a good candidate for lumpectomy and I have been asked to see the patient for consideration of possible adjuvant radiation treatment.    PREVIOUS RADIATION THERAPY: No   PAST MEDICAL HISTORY:  has a past medical history of Arthritis, Breast cancer ([place]), Pituitary adenoma ([place]) (10/05/2012), and Thyroid disease.     PAST SURGICAL HISTORY: Past Surgical History:  Procedure Laterality Date   CATARACT EXTRACTION     mole removed  2013 and 2006   TUBAL LIGATION       FAMILY HISTORY: family history includes Breast cancer in her sister and sister; Lung cancer in her sister; Pancreatic cancer in her sister; Prostate cancer in her brother; Stomach cancer in her paternal grandmother.   SOCIAL HISTORY:  reports that she has never smoked. She has never used smokeless tobacco. She reports current alcohol use. She reports that she does not use drugs.   ALLERGIES: Patient has no known allergies.   MEDICATIONS:  Current Outpatient Medications  Medication Sig Dispense Refill   anastrozole (ARIMIDEX) 1 MG tablet Take 1 tablet (1 mg total) by mouth daily. 90  tablet 4   BIOTIN PO biotin     brimonidine (ALPHAGAN P) 0.1 % SOLN      Cholecalciferol (D3 ADULT PO) Take 1 tablet by mouth daily.     ELDERBERRY PO Take by mouth.     fish oil-omega-3 fatty acids 1000 MG capsule Take 2 g by mouth daily.     Multiple Vitamins-Minerals (MULTIVITAMIN PO) Take by mouth daily.     Multiple Vitamins-Minerals (ZINC PO) Take by mouth.     Nutritional Supplements (GLUCOSAMINE COMPLEX PO) Take by mouth.     travoprost, benzalkonium, (TRAVATAN) 0.004 % ophthalmic solution 1 drop at bedtime.     Turmeric (QC TUMERIC COMPLEX PO) Take by mouth.     No current facility-administered medications for this encounter.     REVIEW OF SYSTEMS:  A 15 point review of systems is documented in the electronic medical record. This was obtained by the nursing staff. However, I reviewed this with the patient to discuss relevant findings and make appropriate changes.  Pertinent items are noted in HPI.    PHYSICAL EXAM:  vitals were not taken for this visit.   ECOG = 0  0 - Asymptomatic (Fully active, able to carry on all predisease activities without restriction)  1 - Symptomatic but completely ambulatory (Restricted in physically strenuous activity but ambulatory and able to carry out work of a light or sedentary nature. For example, light housework, office work)  2 - Symptomatic, <50% in bed during the day (Ambulatory and capable of all self care but unable to  carry out any work activities. Up and about more than 50% of waking hours)  3 - Symptomatic, >50% in bed, but not bedbound (Capable of only limited self-care, confined to bed or chair 50% or more of waking hours)  4 - Bedbound (Completely disabled. Cannot carry on any self-care. Totally confined to bed or chair)  5 - Death   Eustace Pen MM, Creech RH, Tormey DC, et al. [phone]). "Toxicity and response criteria of the [employer]". [place] Oncol. 5 (6): 649-55  Alert, no distress   LABORATORY  DATA:  Lab Results  Component Value Date   WBC 4.4 01/16/2021   HGB 12.1 01/16/2021   HCT 35.6 (L) 01/16/2021   MCV 87.3 01/16/2021   PLT 156 01/16/2021   Lab Results  Component Value Date   NA 141 01/16/2021   K 3.8 01/16/2021   CL 108 01/16/2021   CO2 27 01/16/2021   Lab Results  Component Value Date   ALT 18 01/16/2021   AST 20 01/16/2021   ALKPHOS 83 01/16/2021   BILITOT 0.3 01/16/2021      RADIOGRAPHY: US BREAST LTD UNI RIGHT INC AXILLA  Result Date: 01/03/2021 CLINICAL DATA:  Patient recalled from screening for right breast distortion. EXAM: DIGITAL DIAGNOSTIC UNILATERAL RIGHT MAMMOGRAM WITH TOMOSYNTHESIS AND CAD; ULTRASOUND RIGHT BREAST LIMITED TECHNIQUE: Right digital diagnostic mammography and breast tomosynthesis was performed. The images were evaluated with computer-aided detection.; Targeted ultrasound examination of the right breast was performed COMPARISON:  Previous exam(s). ACR Breast Density Category c: The breast tissue is heterogeneously dense, which may obscure small masses. FINDINGS: Within the central slightly superior right breast middle depth there is a persistent focal area of architectural distortion which persists on spot compression cc views and rolled cc views. Targeted ultrasound is performed, showing no suspicious mass within the superior slightly lateral right breast. No right axillary adenopathy. IMPRESSION: Persistent focal distortion within the superior right breast middle depth without sonographic correlate. RECOMMENDATION: Stereotactic guided core needle biopsy right breast distortion. I have discussed the findings and recommendations with the patient. If applicable, a reminder letter will be sent to the patient regarding the next appointment. BI-RADS CATEGORY  4: Suspicious. Electronically Signed   By: Lovey Newcomer M.D.   On: 01/03/2021 15:13  MM DIAG BREAST TOMO UNI RIGHT  Result Date: 01/03/2021 CLINICAL DATA:  Patient recalled from screening for  right breast distortion. EXAM: DIGITAL DIAGNOSTIC UNILATERAL RIGHT MAMMOGRAM WITH TOMOSYNTHESIS AND CAD; ULTRASOUND RIGHT BREAST LIMITED TECHNIQUE: Right digital diagnostic mammography and breast tomosynthesis was performed. The images were evaluated with computer-aided detection.; Targeted ultrasound examination of the right breast was performed COMPARISON:  Previous exam(s). ACR Breast Density Category c: The breast tissue is heterogeneously dense, which may obscure small masses. FINDINGS: Within the central slightly superior right breast middle depth there is a persistent focal area of architectural distortion which persists on spot compression cc views and rolled cc views. Targeted ultrasound is performed, showing no suspicious mass within the superior slightly lateral right breast. No right axillary adenopathy. IMPRESSION: Persistent focal distortion within the superior right breast middle depth without sonographic correlate. RECOMMENDATION: Stereotactic guided core needle biopsy right breast distortion. I have discussed the findings and recommendations with the patient. If applicable, a reminder letter will be sent to the patient regarding the next appointment. BI-RADS CATEGORY  4: Suspicious. Electronically Signed   By: Lovey Newcomer M.D.   On: 01/03/2021 15:13  MM CLIP PLACEMENT RIGHT  Result Date: 01/04/2021 CLINICAL  DATA:  Status post stereotactic guided core biopsy of RIGHT breast distortion. EXAM: 3D DIAGNOSTIC RIGHT MAMMOGRAM POST STEREOTACTIC BIOPSY COMPARISON:  Previous exam(s). FINDINGS: 3D Mammographic images were obtained following stereotactic guided biopsy of distortion in the UPPER central RIGHT breast and placement of an X shaped clip. The biopsy marking clip is in expected position at the site of biopsy. IMPRESSION: Appropriate positioning of the X shaped biopsy marking clip at the site of biopsy in the UPPER central RIGHT breast distortion. Final Assessment: Post Procedure Mammograms for  Marker Placement Electronically Signed   By: Nolon Nations M.D.   On: 01/04/2021 12:07  MM RT BREAST BX W LOC DEV 1ST LESION IMAGE BX SPEC STEREO GUIDE  Addendum Date: 01/08/2021   ADDENDUM REPORT: 01/08/2021 14:32 ADDENDUM: Pathology revealed DUCTAL CARCINOMA IN SITU WITH CALCIFICATIONS INVOLVING A COMPLEX SCLEROSING LESION of the RIGHT breast, central, X clip. This was found to be concordant by Dr. Nolon Nations. Pathology results were discussed with the patient by telephone. The patient reported doing well after the biopsy with tenderness at the site. Post biopsy instructions and care were reviewed and questions were answered. The patient was encouraged to call [facility] for any additional concerns. The patient was referred to [facility] on January 16, 2021. Pathology results reported by Stacie Acres RN on 01/08/2021. Electronically Signed   By: Nolon Nations M.D.   On: 01/08/2021 14:32   Result Date: 01/08/2021 CLINICAL DATA:  Patient presents for stereotactic guided core biopsy of RIGHT breast distortion. EXAM: RIGHT BREAST STEREOTACTIC CORE NEEDLE BIOPSY COMPARISON:  Previous exams. FINDINGS: The patient and I discussed the procedure of stereotactic-guided biopsy including benefits and alternatives. We discussed the high likelihood of a successful procedure. We discussed the risks of the procedure including infection, bleeding, tissue injury, clip migration, and inadequate sampling. Informed written consent was given. The usual time out protocol was performed immediately prior to the procedure. Using sterile technique and 1% lidocaine and 1% lidocaine with epinephrine as local anesthetic, under stereotactic guidance, a 9 gauge vacuum assisted device was used to perform core needle biopsy of distortion in the UPPER central RIGHT breast using a craniocaudal approach. Lesion quadrant: UPPER central  RIGHT breast At the conclusion of the procedure, X shaped tissue marker clip was deployed into the biopsy cavity. Follow-up 2-view mammogram was performed and dictated separately. IMPRESSION: Stereotactic-guided biopsy of distortion in the RIGHT breast. No apparent complications. Electronically Signed: By: Nolon Nations M.D. On: 01/04/2021 12:06      IMPRESSION/ PLAN:  The patient was seen today in multidisciplinary clinic for her new diagnosis of right-sided DCIS.  She is felt to be a good candidate for lumpectomy.  I discussed with the patient the potential role of adjuvant radiation treatment in her setting.  We discussed that she has a very favorable tumor and depending on her final pathology results, she may have a reasonable option to forego radiation treatment as well.  We did discuss in some detail the logistics of treatment as well as benefits risks and side effects.  All of her questions were answered.  I look forward to seeing her postoperatively to further evaluate her case at that time and to make a final decision.  The patient was seen in person today in clinic.  The total time spent on the patient's visit today was 40 minutes, including chart review, direct discussion/evaluation with the patient, and coordination  of care.      ________________________________   Jodelle Gross, MD, PhD   **Disclaimer: This note was dictated with voice recognition software. Similar sounding words can inadvertently be transcribed and this note may contain transcription errors which may not have been corrected upon publication of note.**

## 2021-01-28 NOTE — Pre-Procedure Instructions (Signed)
Surgical Instructions:    Your procedure is scheduled on Tuesday, July 26th (11 AM- 12 Noon).  Report to [facility] Main Entrance "A" at 09:00 A.M., then check in with the Admitting office.  Call this number if you have any questions prior to your surgery date, or have problems the morning of surgery:  [phone]    Remember:  Do not eat after midnight the night before your surgery.  You may drink clear liquids until 08:00 AM the morning of your surgery.   Clear liquids allowed are: Water, Non-Citrus Juices (without pulp), Carbonated Beverages, Clear Tea, Black Coffee Only, and Gatorade.    Take these medicines the morning of surgery with A SIP OF WATER:   levothyroxine (SYNTHROID) brimonidine (ALPHAGAN) eye drops   IF NEEDED: acetaminophen (TYLENOL) meclizine (ANTIVERT) fluticasone (FLONASE) nasal spray   As of today, STOP taking any Aspirin (unless otherwise instructed by your surgeon) Aleve, Naproxen, Ibuprofen, Motrin, Advil, Goody's, BC's, all herbal medications, fish oil, and all vitamins.             Special instructions:    [facility]- Preparing For Surgery  Before surgery, you can play an important role. Because skin is not sterile, your skin needs to be as free of germs as possible. You can reduce the number of germs on your skin by washing with CHG (chlorahexidine gluconate) Soap before surgery.  CHG is an antiseptic cleaner which kills germs and bonds with the skin to continue killing germs even after washing.     Please do not use if you have an allergy to CHG or antibacterial soaps. If your skin becomes reddened/irritated stop using the CHG.  Do not shave (including legs and underarms) for at least 48 hours prior to first CHG shower. It is OK to shave your face.  Please follow these instructions carefully.     Shower the NIGHT BEFORE SURGERY and the MORNING OF SURGERY with CHG Soap.   If you chose to wash your hair, wash your hair first as usual with your  normal shampoo. After you shampoo, rinse your hair and body thoroughly to remove the shampoo.  Then [employer] and genitals (private parts) with your normal soap and rinse thoroughly to remove soap.  After that Use CHG Soap as you would any other liquid soap. You can apply CHG directly to the skin and wash gently with a scrungie or a clean washcloth.   Apply the CHG Soap to your body ONLY FROM THE NECK DOWN.  Do not use on open wounds or open sores. Avoid contact with your eyes, ears, mouth and genitals (private parts). Wash Face and genitals (private parts)  with your normal soap.   Wash thoroughly, paying special attention to the area where your surgery will be performed.  Thoroughly rinse your body with warm water from the neck down.  DO NOT shower/wash with your normal soap after using and rinsing off the CHG Soap.  Pat yourself dry with a CLEAN TOWEL.  Wear CLEAN PAJAMAS to bed the night before surgery  Place CLEAN SHEETS on your bed the night before your surgery  DO NOT SLEEP WITH PETS.   Day of Surgery:  Take a shower with CHG soap. Wear Clean/Comfortable clothing the morning of surgery Do not wear lotions, powders, perfumes, or deodorant.   Remember to brush your teeth WITH YOUR REGULAR TOOTHPASTE. Do not wear jewelry or makeup. DO Not wear nail polish, gel polish, artificial nails, or any other type of  covering on natural nails including finger and toenails. If patients have artificial nails, gel coating, etc. that need to be removed by a nail salon please have this removed prior to surgery or surgery may need to be canceled/delayed if the surgeon/ anesthesia feels like the patient is unable to be adequately monitored. Do not shave 48 hours prior to surgery.   Do not bring valuables to the hospital. [facility] is not responsible for any belongings or valuables.  Do NOT Smoke (Tobacco/Vaping) or drink Alcohol 24 hours prior to your procedure.  If you use a CPAP at night,  you may bring all equipment for your overnight stay.   Contacts, glasses, dentures or bridgework may not be worn into surgery, please bring cases for these belongings.   For patients admitted to the hospital, discharge time will be determined by your treatment team.  Patients discharged the day of surgery will not be allowed to drive home, and someone needs to stay with them for 24 hours.  ONLY 1 SUPPORT PERSON MAY BE PRESENT WHILE YOU ARE IN SURGERY. IF YOU ARE TO BE ADMITTED ONCE YOU ARE IN YOUR ROOM YOU WILL BE ALLOWED TWO (2) VISITORS.  Minor children may have two parents present. Special consideration for safety and communication needs will be reviewed on a case by case basis.     Please read over the following fact sheets that you were given.

## 2021-01-29 ENCOUNTER — Encounter (HOSPITAL_COMMUNITY)
Admission: RE | Admit: 2021-01-29 | Discharge: 2021-01-29 | Disposition: A | Discharge status: Home / Self Care | Payer: Medicare Other | Source: Ambulatory Visit | Attending: General Surgery | Admitting: General Surgery

## 2021-01-29 ENCOUNTER — Other Ambulatory Visit: Payer: Self-pay

## 2021-01-29 ENCOUNTER — Encounter (HOSPITAL_COMMUNITY): Payer: Self-pay

## 2021-01-29 DIAGNOSIS — Z01812 Encounter for preprocedural laboratory examination: Secondary | ICD-10-CM | POA: Insufficient documentation

## 2021-01-29 HISTORY — DX: Anemia, unspecified: D64.9

## 2021-01-29 HISTORY — DX: Hypothyroidism, unspecified: E03.9

## 2021-01-29 HISTORY — DX: Prediabetes: R73.03

## 2021-01-29 LAB — BASIC METABOLIC PANEL
Anion gap: 8 (ref 5–15)
BUN: 26 mg/dL — ABNORMAL HIGH (ref 8–23)
CO2: 30 mmol/L (ref 22–32)
Calcium: 9.4 mg/dL (ref 8.9–10.3)
Chloride: 103 mmol/L (ref 98–111)
Creatinine, Ser: 1.02 mg/dL — ABNORMAL HIGH (ref 0.44–1.00)
GFR, Estimated: 55 mL/min — ABNORMAL LOW (ref >60)
Glucose, Bld: 116 mg/dL — ABNORMAL HIGH (ref 70–99)
Potassium: 3.6 mmol/L (ref 3.5–5.1)
Sodium: 141 mmol/L (ref 135–145)

## 2021-01-29 LAB — CBC
HCT: 39.7 % (ref 36.0–46.0)
Hemoglobin: 12.9 g/dL (ref 12.0–15.0)
MCH: 29.1 pg (ref 26.0–34.0)
MCHC: 32.5 g/dL (ref 30.0–36.0)
MCV: 89.6 fL (ref 80.0–100.0)
Platelets: 159 10*3/uL (ref 150–400)
RBC: 4.43 MIL/uL (ref 3.87–5.11)
RDW: 13.2 % (ref 11.5–15.5)
WBC: 4.5 10*3/uL (ref 4.0–10.5)
nRBC: 0 % (ref 0.0–0.2)

## 2021-01-29 LAB — HEMOGLOBIN A1C
Hgb A1c MFr Bld: 6.3 % — ABNORMAL HIGH (ref 4.8–5.6)
Mean Plasma Glucose: 134.11 mg/dL

## 2021-01-29 NOTE — Progress Notes (Signed)
PCP - Shirline Frees, MD w/ [facility]; records requested. Cardiologist - Denies Oncologist- Lenward Chancellor, MD  PPM/ICD - Denies  Chest x-ray - N/A EKG - N/A Stress Test - Per pt, within the last 5 years. Pt does not remember where exactly or who did it, but recalls results bring normal ECHO - Denies Cardiac Cath - Denies  Sleep Study - Denies  Per pt, she is pre-diabetic, does not check CBGs. A1C obtained at PAT.  Blood Thinner Instructions: N/A Aspirin Instructions: N/A  ERAS Protcol - Yes PRE-SURGERY Ensure or G2- Not ordered  COVID TEST- N/A; Ambulatory   Anesthesia review: Yes, review last office note and labs from PCP  Patient denies shortness of breath, fever, cough and chest pain at PAT appointment   All instructions explained to the patient, with a verbal understanding of the material. Patient agrees to go over the instructions while at home for a better understanding. Patient also instructed to self quarantine after being tested for COVID-19. The opportunity to ask questions was provided.

## 2021-01-30 ENCOUNTER — Encounter: Payer: Self-pay | Admitting: Genetic Counselor

## 2021-01-30 DIAGNOSIS — Z1379 Encounter for other screening for genetic and chromosomal anomalies: Secondary | ICD-10-CM | POA: Insufficient documentation

## 2021-02-04 ENCOUNTER — Ambulatory Visit
Admission: RE | Admit: 2021-02-04 | Discharge: 2021-02-04 | Disposition: A | Discharge status: Home / Self Care | Payer: Medicare Other | Source: Ambulatory Visit | Attending: General Surgery | Admitting: General Surgery

## 2021-02-04 ENCOUNTER — Other Ambulatory Visit: Payer: Self-pay

## 2021-02-04 DIAGNOSIS — Z170 Estrogen receptor positive status [ER+]: Secondary | ICD-10-CM

## 2021-02-04 DIAGNOSIS — C50411 Malignant neoplasm of upper-outer quadrant of right female breast: Secondary | ICD-10-CM

## 2021-02-04 NOTE — H&P (Signed)
REFERRING PHYSICIAN:  Self   PROVIDER:  Georgianne Fick, MD   MRN: [id number] DOB: [birth year] DATE OF ENCOUNTER: 01/16/2021   Subjective    Chief Complaint: Breast Cancer       History of Present Illness: Kelly Friedman is a 86 y.o. female who is seen today as an office consultation at the request of Dr. Nolon Nations for evaluation of Breast Cancer .       Pt is an 86 yo F who presents with a new diagnosis of screening detected right breast cancer 12/2020.  She had an area of distortion with calcifications seen on her screening mammogram. Diagnostic imaging confirmed this and it showed around 2-3 cm of irregularity.  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     She doesn't have a prior cancer history before this.  She has multiple family members with cancer as outlined below.  She had menarche at age 51, menopause at age 3.  She is a G2P2 with first child at age 27.       Review of Systems: A complete review of systems was obtained from the patient.  I have reviewed this information and discussed as appropriate with the patient.  See HPI as well for other ROS.   Review of Systems  Cardiovascular: Positive for leg swelling.  Musculoskeletal: Positive for joint pain.       Cramps in legs  Skin:       Abnormal moles  All other systems reviewed and are negative.       Medical History: Past Medical History      Past Medical History:  Diagnosis Date   Arthritis     Glaucoma (increased eye pressure)     Shingles     Thyroid disease             Patient Active Problem List  Diagnosis   Malignant neoplasm of upper-outer quadrant of right breast in female, estrogen receptor positive (CMS-HCC)   Family history of cancer      Past Surgical History       Past Surgical History:  Procedure Laterality Date   CATARACT EXTRACTION       CORRECTION HAMMER TOE       MINI-LAPAROTOMY W/ TUBAL  LIGATION            Allergies  No Known Allergies           Current Outpatient Medications on File Prior to Visit  Medication Sig Dispense Refill   biotin 10 mg Tab Take by mouth       cholecalciferol (VITAMIN D3) 1000 unit tablet Take by mouth       docosahexaenoic acid/epa (FISH OIL ORAL) Take by mouth       ascorbic acid,sod/zinc ox,gluc (ZINC AND C ORAL) Take by mouth       brimonidine (ALPHAGAN) 0.2 % ophthalmic solution         ELDERBERRY FRUIT ORAL Take by mouth       fluticasone propionate (FLOVENT DISKUS) 50 mcg/actuation diskus inhaler Inhale 1 inhalation into the lungs 2 (two) times daily       glucosamine-chondr-Boswell-tur 750 mg-600 mg- 50 mg-125 mg TbSQ Take by mouth       levothyroxine (SYNTHROID) 25 MCG tablet TAKE 1 TABLET BY MOUTH EVERY DAY IN THE MORNING ON AN EMPTY STOMACH       meclizine (ANTIVERT) 25 mg tablet Take 25 mg by  mouth 3 (three) times daily as needed for Dizziness       travoprost (TRAVATAN Z) 0.004 % Ophth ophthalmic solution          No current facility-administered medications on file prior to visit.      Family History       Family History  Problem Relation Age of Onset   Breast cancer Sister     Pancreatic cancer Sister     Breast cancer Sister     Lung cancer Sister     Prostate cancer Brother     Stomach cancer Paternal Grandmother          Social History       Tobacco Use  Smoking Status Never Smoker  Smokeless Tobacco Not on file      Social History  Social History         Socioeconomic History   Marital status: Single  Tobacco Use   Smoking status: Never Smoker  Substance and Sexual Activity   Alcohol use: Yes      Alcohol/week: 1.0 standard drink      Types: 1 Standard drinks or equivalent per week   Drug use: Never        Objective:         Vitals:    01/16/21 0900  BP: (!) 148/70  Pulse: 65  Resp: 18  Temp: 36.5 C (97.7 F)  Weight: 75.2 kg (165 lb 12.8 oz)  Height: 162.6 cm ('5\' 4"'$ )    Body  mass index is 28.46 kg/m.   Physical Exam Constitutional:      General: She is not in acute distress.    Appearance: Normal appearance. She is normal weight. She is not ill-appearing.  HENT:     Head: Normocephalic and atraumatic.     Right Ear: External ear normal.     Left Ear: External ear normal.     Nose: Nose normal.  Eyes:     General: No scleral icterus.       Right eye: No discharge.        Left eye: No discharge.     Extraocular Movements: Extraocular movements intact.     Conjunctiva/sclera: Conjunctivae normal.     Pupils: Pupils are equal, round, and reactive to light.  Cardiovascular:     Rate and Rhythm: Normal rate and regular rhythm.     Pulses: Normal pulses.     Heart sounds: Normal heart sounds. No murmur heard. Pulmonary:     Effort: Pulmonary effort is normal.     Breath sounds: Normal breath sounds.  Chest:     Chest wall: No tenderness.  Abdominal:     General: Abdomen is flat. Bowel sounds are normal. There is no distension.     Palpations: Abdomen is soft. There is no mass.     Tenderness: There is no abdominal tenderness. There is no guarding or rebound.     Comments: No hepatosplenomegaly  Musculoskeletal:        General: Swelling (trace edema BLE) present. Normal range of motion.     Cervical back: Normal range of motion and neck supple. No rigidity or tenderness.  Lymphadenopathy:     Cervical: No cervical adenopathy.  Skin:    General: Skin is warm and dry.     Capillary Refill: Capillary refill takes 2 to 3 seconds.     Coloration: Skin is not jaundiced or pale.     Findings: No bruising, erythema, lesion or rash.  Neurological:     General: No focal deficit present.     Mental Status: She is alert and oriented to person, place, and time.     Motor: No weakness.     Coordination: Coordination normal.  Psychiatric:        Mood and Affect: Mood normal.        Behavior: Behavior normal.        Thought Content: Thought content normal.         Judgment: Judgment normal.        .     Labs, Imaging and Diagnostic Testing: Dx mammogram 01/03/21 DIGITAL DIAGNOSTIC UNILATERAL RIGHT MAMMOGRAM WITH TOMOSYNTHESIS AND  CAD; ULTRASOUND RIGHT BREAST LIMITED   TECHNIQUE:  Right digital diagnostic mammography and breast tomosynthesis was  performed. The images were evaluated with computer-aided detection.;  Targeted ultrasound examination of the right breast was performed   COMPARISON:  Previous exam(s).   ACR Breast Density Category c: The breast tissue is heterogeneously  dense, which may obscure small masses.   FINDINGS:  Within the central slightly superior right breast middle depth there  is a persistent focal area of architectural distortion which  persists on spot compression cc views and rolled cc views.   Targeted ultrasound is performed, showing no suspicious mass within  the superior slightly lateral right breast.   No right axillary adenopathy.   IMPRESSION:  Persistent focal distortion within the superior right breast middle  depth without sonographic correlate.   RECOMMENDATION:  Stereotactic guided core needle biopsy right breast distortion.   I have discussed the findings and recommendations with the patient.  If applicable, a reminder letter will be sent to the patient  regarding the next appointment.   BI-RADS CATEGORY  4: Suspicious.      Pathology 01/04/21 DUCTAL CARCINOMA IN SITU WITH CALCIFICATIONS INVOLVING A COMPLEX SCLEROSING LESION. Estrogen Receptor: 95%, POSITIVE, STRONG STAINING INTENSITY Progesterone Receptor: 1%, POSITIVE, STRONG STAINING INTENSITY     Assessment and Plan:  Diagnoses and all orders for this visit:   Malignant neoplasm of upper-outer quadrant of right breast in female, estrogen receptor positive (CMS-HCC) Assessment & Plan: Pt has a new diagnosis of right breast cancer, cTis.  She is a good candidate for breast conservation.  We will plan seed localized  lumpectomy.  This would likely be followed by antiestrogen tx and probably not radiation.     Dr. Jana Hakim discussed COMET with the patient and she did not want to participate with this.     The surgical procedure was described to the patient.  I discussed the incision type and location and that we would need radiology involved on with a wire or seed marker and/or sentinel node.       The risks and benefits of the procedure were described to the patient and she wishes to proceed.     We discussed the risks bleeding, infection, damage to other structures, need for further procedures/surgeries.  We discussed the risk of seroma.  The patient was advised if the area in the breast in cancer, we may need to go back to surgery for additional tissue to obtain negative margins or for a lymph node biopsy. The patient was advised that these are the most common complications, but that others can occur as well.  They were advised against taking aspirin or other anti-inflammatory agents/blood thinners the week before surgery.         Family history of cancer Assessment & Plan: Pt has already had  negative genetic testing at her GYN office.           No follow-ups on file.   Georgianne Fick, MD

## 2021-02-05 ENCOUNTER — Encounter (HOSPITAL_COMMUNITY): Payer: Self-pay | Admitting: General Surgery

## 2021-02-05 ENCOUNTER — Ambulatory Visit
Admission: RE | Admit: 2021-02-05 | Discharge: 2021-02-05 | Disposition: A | Discharge status: Home / Self Care | Payer: Medicare Other | Source: Ambulatory Visit | Attending: General Surgery | Admitting: General Surgery

## 2021-02-05 ENCOUNTER — Ambulatory Visit (HOSPITAL_COMMUNITY): Payer: Medicare Other | Admitting: Physician Assistant

## 2021-02-05 ENCOUNTER — Other Ambulatory Visit: Payer: Self-pay

## 2021-02-05 ENCOUNTER — Ambulatory Visit (HOSPITAL_COMMUNITY)
Admission: RE | Admit: 2021-02-05 | Discharge: 2021-02-05 | Disposition: A | Discharge status: Home / Self Care | Payer: Medicare Other | Attending: General Surgery | Admitting: General Surgery

## 2021-02-05 ENCOUNTER — Encounter (HOSPITAL_COMMUNITY)
Admission: RE | Disposition: A | Discharge status: Home / Self Care | Payer: Self-pay | Source: Home / Self Care | Attending: General Surgery

## 2021-02-05 ENCOUNTER — Ambulatory Visit (HOSPITAL_COMMUNITY): Payer: Medicare Other | Admitting: Anesthesiology

## 2021-02-05 DIAGNOSIS — Z170 Estrogen receptor positive status [ER+]: Secondary | ICD-10-CM | POA: Insufficient documentation

## 2021-02-05 DIAGNOSIS — Z79890 Hormone replacement therapy (postmenopausal): Secondary | ICD-10-CM | POA: Diagnosis not present

## 2021-02-05 DIAGNOSIS — C50411 Malignant neoplasm of upper-outer quadrant of right female breast: Secondary | ICD-10-CM

## 2021-02-05 DIAGNOSIS — Z803 Family history of malignant neoplasm of breast: Secondary | ICD-10-CM | POA: Diagnosis not present

## 2021-02-05 DIAGNOSIS — N6489 Other specified disorders of breast: Secondary | ICD-10-CM | POA: Insufficient documentation

## 2021-02-05 DIAGNOSIS — Z7951 Long term (current) use of inhaled steroids: Secondary | ICD-10-CM | POA: Diagnosis not present

## 2021-02-05 DIAGNOSIS — D0511 Intraductal carcinoma in situ of right breast: Secondary | ICD-10-CM | POA: Diagnosis not present

## 2021-02-05 HISTORY — PX: BREAST LUMPECTOMY: SHX2

## 2021-02-05 HISTORY — PX: BREAST LUMPECTOMY WITH RADIOACTIVE SEED LOCALIZATION: SHX6424

## 2021-02-05 SURGERY — BREAST LUMPECTOMY WITH RADIOACTIVE SEED LOCALIZATION
Anesthesia: General | Site: Breast | Laterality: Right

## 2021-02-05 MED ORDER — LIDOCAINE-EPINEPHRINE 1 %-1:100000 IJ SOLN
INTRAMUSCULAR | Status: AC
  Filled 2021-02-05: qty 1

## 2021-02-05 MED ORDER — CHLORHEXIDINE GLUCONATE 0.12 % MT SOLN
OROMUCOSAL | Status: AC
  Administered 2021-02-05: 15 mL via OROMUCOSAL
  Filled 2021-02-05: qty 15

## 2021-02-05 MED ORDER — ONDANSETRON HCL 4 MG/2ML IJ SOLN
INTRAMUSCULAR | Status: AC
  Filled 2021-02-05: qty 2

## 2021-02-05 MED ORDER — ACETAMINOPHEN 500 MG PO TABS: 1000.0000 mg | ORAL_TABLET | ORAL | Status: AC

## 2021-02-05 MED ORDER — ORAL CARE MOUTH RINSE: 15.0000 mL | Freq: Once | OROMUCOSAL | Status: AC

## 2021-02-05 MED ORDER — GLYCOPYRROLATE 0.2 MG/ML IJ SOLN
INTRAMUSCULAR | Status: DC | PRN
  Administered 2021-02-05: .2 mg via INTRAVENOUS

## 2021-02-05 MED ORDER — LACTATED RINGERS IV SOLN: INTRAVENOUS | Status: DC

## 2021-02-05 MED ORDER — EPHEDRINE SULFATE-NACL 50-0.9 MG/10ML-% IV SOSY
PREFILLED_SYRINGE | INTRAVENOUS | Status: DC | PRN
  Administered 2021-02-05: 5 mg via INTRAVENOUS

## 2021-02-05 MED ORDER — CHLORHEXIDINE GLUCONATE 0.12 % MT SOLN: 15.0000 mL | Freq: Once | OROMUCOSAL | Status: AC

## 2021-02-05 MED ORDER — CHLORHEXIDINE GLUCONATE CLOTH 2 % EX PADS: 6.0000 | MEDICATED_PAD | Freq: Once | CUTANEOUS | Status: DC

## 2021-02-05 MED ORDER — BUPIVACAINE HCL (PF) 0.25 % IJ SOLN
INTRAMUSCULAR | Status: AC
  Filled 2021-02-05: qty 30

## 2021-02-05 MED ORDER — PROPOFOL 10 MG/ML IV BOLUS
INTRAVENOUS | Status: DC | PRN
  Administered 2021-02-05: 100 mg via INTRAVENOUS
  Administered 2021-02-05: 60 mg via INTRAVENOUS

## 2021-02-05 MED ORDER — LIDOCAINE HCL 1 % IJ SOLN
INTRAMUSCULAR | Status: DC | PRN
  Administered 2021-02-05: 40 mL

## 2021-02-05 MED ORDER — FENTANYL CITRATE (PF) 100 MCG/2ML IJ SOLN
INTRAMUSCULAR | Status: DC | PRN
  Administered 2021-02-05: 100 ug via INTRAVENOUS
  Administered 2021-02-05 (×2): 50 ug via INTRAVENOUS

## 2021-02-05 MED ORDER — OXYCODONE HCL 5 MG PO TABS: 5.0000 mg | ORAL_TABLET | Freq: Four times a day (QID) | ORAL | 0 refills | Status: DC | PRN

## 2021-02-05 MED ORDER — PHENYLEPHRINE 40 MCG/ML (10ML) SYRINGE FOR IV PUSH (FOR BLOOD PRESSURE SUPPORT)
PREFILLED_SYRINGE | INTRAVENOUS | Status: AC
  Filled 2021-02-05: qty 10

## 2021-02-05 MED ORDER — ACETAMINOPHEN 500 MG PO TABS
1000.0000 mg | ORAL_TABLET | Freq: Once | ORAL | Status: AC
  Administered 2021-02-05: 1000 mg via ORAL
  Filled 2021-02-05: qty 2

## 2021-02-05 MED ORDER — ROCURONIUM BROMIDE 10 MG/ML (PF) SYRINGE: PREFILLED_SYRINGE | INTRAVENOUS | Status: DC | PRN

## 2021-02-05 MED ORDER — DEXAMETHASONE SODIUM PHOSPHATE 10 MG/ML IJ SOLN
INTRAMUSCULAR | Status: DC | PRN
  Administered 2021-02-05: 5 mg via INTRAVENOUS

## 2021-02-05 MED ORDER — FENTANYL CITRATE (PF) 250 MCG/5ML IJ SOLN
INTRAMUSCULAR | Status: AC
  Filled 2021-02-05: qty 5

## 2021-02-05 MED ORDER — SUCCINYLCHOLINE CHLORIDE 200 MG/10ML IV SOSY
PREFILLED_SYRINGE | INTRAVENOUS | Status: DC | PRN
  Administered 2021-02-05: 120 mg via INTRAVENOUS

## 2021-02-05 MED ORDER — CEFAZOLIN SODIUM-DEXTROSE 2-4 GM/100ML-% IV SOLN
INTRAVENOUS | Status: AC
  Filled 2021-02-05: qty 100

## 2021-02-05 MED ORDER — 0.9 % SODIUM CHLORIDE (POUR BTL) OPTIME
TOPICAL | Status: DC | PRN
  Administered 2021-02-05: 1000 mL

## 2021-02-05 MED ORDER — CEFAZOLIN SODIUM-DEXTROSE 2-4 GM/100ML-% IV SOLN
2.0000 g | INTRAVENOUS | Status: AC
  Administered 2021-02-05: 2 g via INTRAVENOUS

## 2021-02-05 MED ORDER — ONDANSETRON HCL 4 MG/2ML IJ SOLN
INTRAMUSCULAR | Status: DC | PRN
  Administered 2021-02-05: 4 mg via INTRAVENOUS

## 2021-02-05 MED ORDER — DEXAMETHASONE SODIUM PHOSPHATE 10 MG/ML IJ SOLN
INTRAMUSCULAR | Status: AC
  Filled 2021-02-05: qty 1

## 2021-02-05 MED ORDER — LIDOCAINE HCL (CARDIAC) PF 100 MG/5ML IV SOSY
PREFILLED_SYRINGE | INTRAVENOUS | Status: DC | PRN
Start: 2021-02-05 — End: 2021-02-05
  Administered 2021-02-05: 60 mg via INTRAVENOUS

## 2021-02-05 MED ORDER — LIDOCAINE 2% (20 MG/ML) 5 ML SYRINGE
INTRAMUSCULAR | Status: AC
  Filled 2021-02-05: qty 5

## 2021-02-05 SURGICAL SUPPLY — 46 items
ADH SKN CLS APL DERMABOND .7 (GAUZE/BANDAGES/DRESSINGS) ×1
APL PRP STRL LF DISP 70% ISPRP (MISCELLANEOUS) ×1
BAG COUNTER SPONGE SURGICOUNT (BAG) ×2 IMPLANT
BAG SPNG CNTER NS LX DISP (BAG) ×1
BINDER BREAST LRG (GAUZE/BANDAGES/DRESSINGS) IMPLANT
BINDER BREAST XLRG (GAUZE/BANDAGES/DRESSINGS) ×1 IMPLANT
BLADE SURG 10 STRL SS (BLADE) ×2 IMPLANT
CANISTER SUCT 3000ML PPV (MISCELLANEOUS) IMPLANT
CHLORAPREP W/TINT 26 (MISCELLANEOUS) ×2 IMPLANT
CLIP VESOCCLUDE LG 6/CT (CLIP) ×2 IMPLANT
CLSR STERI-STRIP ANTIMIC 1/2X4 (GAUZE/BANDAGES/DRESSINGS) ×1 IMPLANT
COVER PROBE W GEL 5X96 (DRAPES) ×2 IMPLANT
COVER SURGICAL LIGHT HANDLE (MISCELLANEOUS) ×2 IMPLANT
DERMABOND ADVANCED (GAUZE/BANDAGES/DRESSINGS) ×1
DERMABOND ADVANCED .7 DNX12 (GAUZE/BANDAGES/DRESSINGS) ×1 IMPLANT
DEVICE DUBIN SPECIMEN MAMMOGRA (MISCELLANEOUS) ×2 IMPLANT
DRAPE CHEST BREAST 15X10 FENES (DRAPES) ×2 IMPLANT
DRSG PAD ABDOMINAL 8X10 ST (GAUZE/BANDAGES/DRESSINGS) ×2 IMPLANT
ELECT COATED BLADE 2.86 ST (ELECTRODE) ×2 IMPLANT
ELECT REM PT RETURN 9FT ADLT (ELECTROSURGICAL) ×2
ELECTRODE REM PT RTRN 9FT ADLT (ELECTROSURGICAL) ×1 IMPLANT
GAUZE SPONGE 4X4 12PLY STRL LF (GAUZE/BANDAGES/DRESSINGS) ×2 IMPLANT
GLOVE SURG ENC MOIS LTX SZ6 (GLOVE) ×2 IMPLANT
GLOVE SURG UNDER LTX SZ6.5 (GLOVE) ×2 IMPLANT
GOWN STRL REUS W/ TWL LRG LVL3 (GOWN DISPOSABLE) ×1 IMPLANT
GOWN STRL REUS W/TWL 2XL LVL3 (GOWN DISPOSABLE) ×2 IMPLANT
GOWN STRL REUS W/TWL LRG LVL3 (GOWN DISPOSABLE) ×2
KIT BASIN OR (CUSTOM PROCEDURE TRAY) ×2 IMPLANT
KIT MARKER MARGIN INK (KITS) ×2 IMPLANT
LIGHT WAVEGUIDE WIDE FLAT (MISCELLANEOUS) IMPLANT
NDL HYPO 25GX1X1/2 BEV (NEEDLE) ×1 IMPLANT
NEEDLE HYPO 25GX1X1/2 BEV (NEEDLE) ×2 IMPLANT
NS IRRIG 1000ML POUR BTL (IV SOLUTION) IMPLANT
PACK GENERAL/GYN (CUSTOM PROCEDURE TRAY) ×2 IMPLANT
PAD ABD 7.5X8 STRL (GAUZE/BANDAGES/DRESSINGS) ×1 IMPLANT
SPONGE T-LAP 18X18 ~~LOC~~+RFID (SPONGE) ×1 IMPLANT
STRIP CLOSURE SKIN 1/2X4 (GAUZE/BANDAGES/DRESSINGS) ×2 IMPLANT
SUT MNCRL AB 4-0 PS2 18 (SUTURE) ×2 IMPLANT
SUT SILK 2 0 SH (SUTURE) IMPLANT
SUT VIC AB 2-0 SH 27 (SUTURE) ×2
SUT VIC AB 2-0 SH 27XBRD (SUTURE) ×1 IMPLANT
SUT VIC AB 3-0 SH 27 (SUTURE) ×2
SUT VIC AB 3-0 SH 27X BRD (SUTURE) ×1 IMPLANT
SYR CONTROL 10ML LL (SYRINGE) ×2 IMPLANT
TOWEL GREEN STERILE (TOWEL DISPOSABLE) ×2 IMPLANT
TOWEL GREEN STERILE FF (TOWEL DISPOSABLE) ×2 IMPLANT

## 2021-02-05 NOTE — Anesthesia Postprocedure Evaluation (Signed)
Anesthesia Post Note  Patient: RAEDENE ARAGONES  Procedure(s) Performed: RIGHT BREAST LUMPECTOMY WITH RADIOACTIVE SEED LOCALIZATION (Right: Breast)     Patient location during evaluation: PACU Anesthesia Type: General Level of consciousness: awake Pain management: pain level controlled Vital Signs Assessment: post-procedure vital signs reviewed and stable Respiratory status: spontaneous breathing and respiratory function stable Cardiovascular status: stable Postop Assessment: no apparent nausea or vomiting Anesthetic complications: no   No notable events documented.  Last Vitals:  Vitals:   02/05/21 1409 02/05/21 1416  BP: (!) 150/75 (!) 152/79  Pulse: 84 82  Resp: 14 18  Temp:  36.5 C  SpO2: 97% 97%    Last Pain:  Vitals:   02/05/21 1416  TempSrc:   PainSc: 0-No pain                 Merlinda Frederick

## 2021-02-05 NOTE — Anesthesia Preprocedure Evaluation (Addendum)
Anesthesia Evaluation  Patient identified by MRN, date of birth, ID band Patient awake    Reviewed: Allergy & Precautions, NPO status , Patient's Chart, lab work & pertinent test results  Airway Mallampati: II  TM Distance: >3 FB Neck ROM: Full    Dental no notable dental hx.    Pulmonary neg pulmonary ROS,    Pulmonary exam normal breath sounds clear to auscultation       Cardiovascular negative cardio ROS Normal cardiovascular exam Rhythm:Regular Rate:Normal     Neuro/Psych Pituitary adenoma negative psych ROS   GI/Hepatic negative GI ROS, Neg liver ROS,   Endo/Other  Hypothyroidism   Renal/GU negative Renal ROS  negative genitourinary   Musculoskeletal  (+) Arthritis , Osteoarthritis,    Abdominal   Peds negative pediatric ROS (+)  Hematology negative hematology ROS (+) anemia ,   Anesthesia Other Findings Breast cancer  Reproductive/Obstetrics negative OB ROS                            Anesthesia Physical Anesthesia Plan  ASA: 2  Anesthesia Plan: General   Post-op Pain Management:    Induction: Intravenous  PONV Risk Score and Plan: 3 and Treatment may vary due to age or medical condition and Ondansetron  Airway Management Planned: LMA  Additional Equipment: None  Intra-op Plan:   Post-operative Plan: Extubation in OR  Informed Consent: I have reviewed the patients History and Physical, chart, labs and discussed the procedure including the risks, benefits and alternatives for the proposed anesthesia with the patient or authorized representative who has indicated his/her understanding and acceptance.     Dental advisory given  Plan Discussed with: CRNA and Anesthesiologist  Anesthesia Plan Comments:         Anesthesia Quick Evaluation

## 2021-02-05 NOTE — Anesthesia Procedure Notes (Signed)
Procedure Name: Intubation Date/Time: 02/05/2021 12:11 PM Performed by: Jonna Munro, CRNA Pre-anesthesia Checklist: Patient identified, Emergency Drugs available, Suction available, Patient being monitored and Timeout performed Patient Re-evaluated:Patient Re-evaluated prior to induction Oxygen Delivery Method: Circle system utilized Preoxygenation: Pre-oxygenation with 100% oxygen Induction Type: IV induction Laryngoscope Size: Mac and 4 Grade View: Grade I Tube size: 7.0 mm Number of attempts: 1 Airway Equipment and Method: Stylet

## 2021-02-05 NOTE — Op Note (Signed)
Right Breast Radioactive seed localized lumpectomy  Indications: This patient presents with history of right breast cancer, cTis, upper outer quadrant, +/+ receptors  Pre-operative Diagnosis: right breast cancer  Post-operative Diagnosis: Same  Surgeon: Stark Klein   Anesthesia: General endotracheal anesthesia  ASA Class: 2  Procedure Details  The patient was seen in the Holding Room. The risks, benefits, complications, treatment options, and expected outcomes were discussed with the patient. The possibilities of bleeding, infection, the need for additional procedures, failure to diagnose a condition, and creating a complication requiring other procedures or operations were discussed with the patient. The patient concurred with the proposed plan, giving informed consent.  The site of surgery properly noted/marked. The patient was taken to Operating Room # 2, identified, and the procedure verified as right breast seed localized lumpectomy.  The right breast and chest were prepped and draped in standard fashion. A superior circumareolar incision was made near the previously placed radioactive seed.  Dissection was carried down around the point of maximum signal intensity. The cautery was used to perform the dissection.   The specimen was inked with the margin marker paint kit.    Specimen radiography confirmed inclusion of the mammographic lesion, the clip, and the seed.  The background signal in the breast was zero.  Hemostasis was achieved with cautery.  The cavity was marked with clips on each border other than the anterior border.  The wound was irrigated and closed with 3-0 vicryl interrupted deep dermal sutures and 4-0 monocryl running subcuticular suture.      Sterile dressings were applied. At the end of the operation, all sponge, instrument, and needle counts were correct.   Findings: Seed, clip in specimen.  anterior margin is skin   Estimated Blood Loss:  min         Specimens:  right breast tissue with seed         Complications:  None; patient tolerated the procedure well.         Disposition: PACU - hemodynamically stable.         Condition: stable

## 2021-02-05 NOTE — Interval H&P Note (Signed)
History and Physical Interval Note:  02/05/2021 9:31 AM  Kelly Friedman  has presented today for surgery, with the diagnosis of RIGHT BREAST CANCER.  The various methods of treatment have been discussed with the patient and family. After consideration of risks, benefits and other options for treatment, the patient has consented to  Procedure(s): RIGHT BREAST LUMPECTOMY WITH RADIOACTIVE SEED LOCALIZATION (Right) as a surgical intervention.  The patient's history has been reviewed, patient examined, no change in status, stable for surgery.  I have reviewed the patient's chart and labs.  Questions were answered to the patient's satisfaction.     Stark Klein

## 2021-02-05 NOTE — Discharge Instructions (Addendum)
[facility] Office Phone Number [phone]  BREAST BIOPSY/ PARTIAL MASTECTOMY: POST OP INSTRUCTIONS  Always review your discharge instruction sheet given to you by the facility where your surgery was performed.  IF YOU HAVE DISABILITY OR FAMILY LEAVE FORMS, YOU MUST BRING THEM TO THE OFFICE FOR PROCESSING.  DO NOT GIVE THEM TO YOUR DOCTOR.  A prescription for pain medication may be given to you upon discharge.  Take your pain medication as prescribed, if needed.  If narcotic pain medicine is not needed, then you may take acetaminophen (Tylenol) or ibuprofen (Advil) as needed. Take your usually prescribed medications unless otherwise directed If you need a refill on your pain medication, please contact your pharmacy.  They will contact our office to request authorization.  Prescriptions will not be filled after 5pm or on week-ends. You should eat very light the first 24 hours after surgery, such as soup, crackers, pudding, etc.  Resume your normal diet the day after surgery. Most patients will experience some swelling and bruising in the breast.  Ice packs and a good support bra will help.  Swelling and bruising can take several days to resolve.  It is common to experience some constipation if taking pain medication after surgery.  Increasing fluid intake and taking a stool softener will usually help or prevent this problem from occurring.  A mild laxative (Milk of Magnesia or Miralax) should be taken according to package directions if there are no bowel movements after 48 hours. Unless discharge instructions indicate otherwise, you may remove your bandages 48 hours after surgery, and you may shower at that time.  You may have steri-strips (small skin tapes) in place directly over the incision.  These strips should be left on the skin for 7-10 days.   Any sutures or staples will be removed at the office during your follow-up visit. ACTIVITIES:  You may resume regular daily activities  (gradually increasing) beginning the next day.  Wearing a good support bra or sports bra (or the breast binder) minimizes pain and swelling.  You may have sexual intercourse when it is comfortable. You may drive when you no longer are taking prescription pain medication, you can comfortably wear a seatbelt, and you can safely maneuver your car and apply brakes. RETURN TO WORK:  __________1 week_______________ You should see your doctor in the office for a follow-up appointment approximately two weeks after your surgery.  Your doctor's nurse will typically make your follow-up appointment when she calls you with your pathology report.  Expect your pathology report 2-3 business days after your surgery.  You may call to check if you do not hear from us after three days.   WHEN TO CALL YOUR DOCTOR: Fever over 101.0 Nausea and/or vomiting. Extreme swelling or bruising. Continued bleeding from incision. Increased pain, redness, or drainage from the incision.  The clinic staff is available to answer your questions during regular business hours.  Please don't hesitate to call and ask to speak to one of the nurses for clinical concerns.  If you have a medical emergency, go to the nearest emergency room or call 911.  A surgeon from [facility] is always on call at the hospital.  For further questions, please visit [email or web address]   

## 2021-02-05 NOTE — Transfer of Care (Signed)
Immediate Anesthesia Transfer of Care Note  Patient: KAIZLEIGH BARRIOS  Procedure(s) Performed: RIGHT BREAST LUMPECTOMY WITH RADIOACTIVE SEED LOCALIZATION (Right: Breast)  Patient Location: PACU  Anesthesia Type:General  Level of Consciousness: awake, alert  and patient cooperative  Airway & Oxygen Therapy: Patient Spontanous Breathing and Patient connected to face mask oxygen  Post-op Assessment: Report given to RN, Post -op Vital signs reviewed and stable and Patient moving all extremities X 4  Post vital signs: Reviewed and stable  Last Vitals:  Vitals Value Taken Time  BP 164/69 02/05/21 1321  Temp    Pulse 95 02/05/21 1322  Resp 16 02/05/21 1322  SpO2 94 % 02/05/21 1322  Vitals shown include unvalidated device data.  Last Pain:  Vitals:   02/05/21 0914  TempSrc:   PainSc: 0-No pain      Patients Stated Pain Goal: 2 ([id number] [phone]  Complications: No notable events documented.

## 2021-02-06 ENCOUNTER — Encounter (HOSPITAL_COMMUNITY): Payer: Self-pay | Admitting: General Surgery

## 2021-02-07 LAB — SURGICAL PATHOLOGY

## 2021-02-11 ENCOUNTER — Encounter: Payer: Self-pay | Admitting: *Deleted

## 2021-02-12 DIAGNOSIS — D0590 Unspecified type of carcinoma in situ of unspecified breast: Secondary | ICD-10-CM | POA: Insufficient documentation

## 2021-03-18 ENCOUNTER — Other Ambulatory Visit: Payer: Self-pay | Admitting: Oncology

## 2021-04-18 ENCOUNTER — Ambulatory Visit: Payer: Medicare Other | Admitting: Oncology

## 2021-05-14 NOTE — Progress Notes (Signed)
[place]  [phone] Fax:([phone]) [phone]     ID: Kelly Friedman DOB: [birth year]  MR#: [id number]  YBO#:[id number]  Patient Care Team: Shirline Frees, MD as PCP - General (Family Medicine) Rockwell Germany, RN as Oncology Nurse Navigator Mauro Kaufmann, RN as Oncology Nurse Navigator Trevonne Nyland, Virgie Dad, MD as Consulting Physician (Oncology) Stark Klein, MD as Consulting Physician (General Surgery) Kyung Rudd, MD as Consulting Physician (Radiation Oncology) Marylynn Pearson, MD as Consulting Physician (Obstetrics and Gynecology) Dial, Sherrie George, DPM as Referring Physician (Podiatry) Lonia Skinner, MD as Consulting Physician (Ophthalmology) Darrick Huntsman, MD as Referring Physician (Orthopedic Surgery) Sydnee Levans, MD as Referring Physician (Dermatology) Chauncey Cruel, MD OTHER MD:  CHIEF COMPLAINT: Estrogen receptor positive noninvasive breast cancer  CURRENT TREATMENT: anastrozole   INTERVAL HISTORY: Kelly Friedman returns today for follow up of her noninvasive breast cancer. She was evaluated in the multidisciplinary breast cancer clinic on 01/16/2021.  She was started on anastrozole neoadjuvantly at consultation.  She is tolerating this generally quite well.  She has a few hot flashes which occasionally wake her up at night.  She is not interested in taking gabapentin or other medications to deal with this.  She proceeded to right lumpectomy on 02/05/2021 under Dr. Barry Dienes. Pathology from the procedure [id number]) showed: ductal carcinoma in situ, intermediate grade, involving a complex sclerosing lesion (1.6 cm); margins uninvolved; calcifications involving carcinoma and complex sclerosing lesion.   REVIEW OF SYSTEMS: Kelly Friedman tolerated her surgery well.  The big change since her last visit is her husband's sudden death.  She thinks he had pneumonia.  He was actually not diagnosed.  He just did not feel well a few days went to bed and  did not wake up.  She is now of course living by herself in their townhouse.  She is having to deal with all the problems.  She is unusually capable I think for someone her age to deal with all that.  She does have a daughter also in town. 84   COVID 19 VACCINATION STATUS: Pfizer x3   HISTORY OF CURRENT ILLNESS: From the original intake note:  MYSTERY SCHRUPP had routine screening mammography on 12/11/2020 showing a possible abnormality in the right breast. She underwent right diagnostic mammography with tomography and right breast ultrasonography at [facility] on 01/03/2021 showing: breast density category C; persistent focal distortion within superior right breast without sonographic correlate.  Accordingly on 01/04/2021 she proceeded to biopsy of the right breast area in question. The pathology from this procedure ([id number]) showed: ductal carcinoma in situ with calcifications involving a complex sclerosing lesion. Prognostic indicators significant for: estrogen receptor, 95% positive and progesterone receptor, 1% positive, both with strong staining intensity.   Cancer Staging Ductal carcinoma in situ (DCIS) of right breast Staging form: Breast, AJCC 8th Edition - Clinical stage from 01/16/2021: Stage 0 (cTis (DCIS), cN0, cM0, ER+, PR+) - Signed by Chauncey Cruel, MD on 01/16/2021 Stage prefix: Initial diagnosis Nuclear grade: G3  The patient's subsequent history is as detailed below.   PAST MEDICAL HISTORY: Past Medical History:  Diagnosis Date   Anemia    Arthritis    Breast cancer ([place])    Hypothyroidism    Pituitary adenoma ([place]) 10/05/2012   Pre-diabetes    Thyroid disease     PAST SURGICAL HISTORY: Past Surgical History:  Procedure Laterality Date   APPENDECTOMY     BREAST LUMPECTOMY WITH RADIOACTIVE SEED LOCALIZATION Right 02/05/2021   Procedure:  RIGHT BREAST LUMPECTOMY WITH RADIOACTIVE SEED LOCALIZATION;  Surgeon: Stark Klein, MD;  Location: [place];  Service:  General;  Laterality: Right;   CATARACT EXTRACTION     DILATION AND CURETTAGE OF UTERUS     EYE SURGERY     HAMMER TOE SURGERY Right    mole removed  2013 and 2006   TUBAL LIGATION      FAMILY HISTORY: Family History  Problem Relation Age of Onset   Breast cancer Sister    Pancreatic cancer Sister    Breast cancer Sister    Lung cancer Sister    Prostate cancer Brother    Stomach cancer Paternal Grandmother    Her father died at age 67 from cirrhosis (alcohol-induced). Her mother died at age 46. Kelly Friedman has two brothers and four sisters. She reports breast cancer in two sisters, one at age 27, who was also diagnosed with pancreatic cancer, and the other at age 21. She also reports lung cancer in a sister at age 52, prostate cancer in a brother at age 58, and stomach cancer in her paternal grandmother.   GYNECOLOGIC HISTORY:  No LMP recorded. Patient is postmenopausal. Menarche: 86 years old Age at first live birth: 86 years old [place] P 2 LMP 1995 Contraceptive never used HRT never used  Hysterectomy? no BSO? 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 Husband Kelly Friedman was a retired Librarian, academic.  He died from pneumonia at home in 2022. Daughter Kelly Friedman, age 9, is a group Gaffer here in [place]. Son Kelly Friedman, age 27, is a Curator in [place], [place]. Kelly Friedman has three grandchildren, aged 15, 19, and 109. She has no great-grandchildren. She attends a [employer].    ADVANCED DIRECTIVES: To be determined   HEALTH MAINTENANCE: Social History   Tobacco Use   Smoking status: Never   Smokeless tobacco: Never  Vaping Use   Vaping Use: Never used  Substance Use Topics   Alcohol use: Yes    Comment: 1 month   Drug use: No     Colonoscopy: 2012 (with [facility])  PAP: 2017  Bone density: 12/2019   No Known Allergies  Current Outpatient Medications  Medication Sig Dispense  Refill   acetaminophen (TYLENOL) 500 MG tablet Take 500-1,000 mg by mouth every 6 (six) hours as needed (pain).     anastrozole (ARIMIDEX) 1 MG tablet Take 1 tablet (1 mg total) by mouth daily. (Patient taking differently: Take 1 mg by mouth daily with lunch.) 90 tablet 4   Ascorbic Acid (VITAMIN C) 1000 MG tablet Take 1,000 mg by mouth in the morning.     Biotin 10000 MCG TBDP Take 10,000 mcg by mouth in the morning.     Boswellia-Glucosamine-Vit D (OSTEO BI-FLEX ONE PER DAY) TABS Take 1 tablet by mouth in the morning. W/Turmeric     brimonidine (ALPHAGAN) 0.2 % ophthalmic solution Place 1 drop into both eyes in the morning and at bedtime.     Cholecalciferol (VITAMIN D3) 50 MCG (2000 UT) TABS Take 2,000 Units by mouth in the morning.     ELDERBERRY PO Take 2 tablets by mouth in the morning. Elderberry Gummies     fluticasone (FLONASE) 50 MCG/ACT nasal spray Place 1 spray into both nostrils daily as needed for allergies or rhinitis.     levothyroxine (SYNTHROID) 25 MCG tablet Take 25 mcg by mouth every morning.     meclizine (ANTIVERT)  25 MG tablet Take 25 mg by mouth 3 (three) times daily as needed (vertigo).     Multiple Vitamin (MULTIVITAMIN WITH MINERALS) TABS tablet Take 1 tablet by mouth daily. Women's Multivitamin     oxyCODONE (OXY IR/ROXICODONE) 5 MG immediate release tablet Take 1 tablet (5 mg total) by mouth every 6 (six) hours as needed for severe pain. 8 tablet 0   psyllium (METAMUCIL SMOOTH TEXTURE) 28 % packet Take 1 packet by mouth daily as needed (digestive regularity).     Travoprost, BAK Free, (TRAVATAN) 0.004 % SOLN ophthalmic solution Place 1 drop into both eyes at bedtime.     Zinc 50 MG TABS Take 50 mg by mouth in the morning.     No current facility-administered medications for this visit.    OBJECTIVE: African-American woman who appears younger than stated age  17:   05/15/21 1120  BP: (!) 150/76  Pulse: 79  Resp: 16  Temp: 97.7 F (36.5 C)  SpO2: 100%      Body mass index is 28.55 kg/m.   Wt Readings from Last 3 Encounters:  05/15/21 166 lb 4.8 oz (75.4 kg)  02/05/21 164 lb (74.4 kg)  01/29/21 165 lb 12.8 oz (75.2 kg)      ECOG FS:1 - Symptomatic but completely ambulatory  Sclerae unicteric, EOMs intact Wearing a mask No cervical or supraclavicular adenopathy Lungs no rales or rhonchi Heart regular rate and rhythm Abd soft, nontender, positive bowel sounds MSK no focal spinal tenderness, no upper extremity lymphedema Neuro: nonfocal, well oriented, appropriate affect Breasts: The right breast is status postlumpectomy.  The cosmetic result is good.  There is no evidence of local recurrence.  The left breast and both axillae are benign.   LAB RESULTS:  CMP     Component Value Date/Time   NA 141 01/29/2021 1500   K 3.6 01/29/2021 1500   CL 103 01/29/2021 1500   CO2 30 01/29/2021 1500   GLUCOSE 116 (H) 01/29/2021 1500   BUN 26 (H) 01/29/2021 1500   CREATININE 1.02 (H) 01/29/2021 1500   CREATININE 0.90 01/16/2021 0819   CALCIUM 9.4 01/29/2021 1500   PROT 6.8 01/16/2021 0819   ALBUMIN 3.2 (L) 01/16/2021 0819   AST 20 01/16/2021 0819   ALT 18 01/16/2021 0819   ALKPHOS 83 01/16/2021 0819   BILITOT 0.3 01/16/2021 0819   GFRNONAA 55 (L) 01/29/2021 1500   GFRNONAA >60 01/16/2021 0819    No results found for: TOTALPROTELP, ALBUMINELP, A1GS, A2GS, BETS, BETA2SER, GAMS, MSPIKE, SPEI  Lab Results  Component Value Date   WBC 4.5 01/29/2021   NEUTROABS 1.9 01/16/2021   HGB 12.9 01/29/2021   HCT 39.7 01/29/2021   MCV 89.6 01/29/2021   PLT 159 01/29/2021    No results found for: LABCA2  No components found for: LAGTXM468  No results for input(s): INR in the last 168 hours.  No results found for: LABCA2  No results found for: EHO122  No results found for: QMG500  No results found for: BBC488  No results found for: CA2729  No components found for: HGQUANT  No results found for: CEA1 / No results found for:  CEA1   No results found for: AFPTUMOR  No results found for: CHROMOGRNA  No results found for: KPAFRELGTCHN, LAMBDASER, KAPLAMBRATIO (kappa/lambda light chains)  No results found for: HGBA, HGBA2QUANT, HGBFQUANT, HGBSQUAN (Hemoglobinopathy evaluation)   No results found for: LDH  No results found for: IRON, TIBC, IRONPCTSAT (Iron and TIBC)  No results  found for: FERRITIN  Urinalysis No results found for: COLORURINE, APPEARANCEUR, LABSPEC, PHURINE, GLUCOSEU, HGBUR, BILIRUBINUR, KETONESUR, PROTEINUR, UROBILINOGEN, NITRITE, LEUKOCYTESUR   STUDIES: No results found.   ELIGIBLE FOR AVAILABLE RESEARCH PROTOCOL: no  ASSESSMENT: 86 y.o. [place] woman status post right breast biopsy 01/04/2021 for ductal carcinoma in situ, estrogen and progesterone receptor positive  (1) genetics testing (A) Negative hereditary cancer genetic testing: no pathogenic variants detected in [facility] Panel.  Variants of uncertain significance detected in AXIN2 at c.1235A>G (p.Asn412Ser) and RNF43 at c.2054C>A (p.Thr685Asn).  The report date is February 10, 2019.    The [facility] gene panel offered by [employer] includes sequencing and deletion/duplication testing of the following 35 genes: APC, ATM, AXIN2, BARD1, BMPR1A, BRCA1, BRCA2, BRIP1, CHD1, CDK4, CDKN2A, CHEK2, EPCAM (large rearrangement only), HOXB13, (sequencing only), GALNT12, MLH1, MSH2, MSH3 (excluding repetitive portions of exon 1), MSH6, MUTYH, NBN, NTHL1, PALB2, PMS2, PTEN, RAD51C, RAD51D, RNF43, RPS20, SMAD4, STK11, and TP53. Sequencing was performed for select regions of POLE and POLD1, and large rearrangement analysis was performed for select regions of GREM1.  (2) status post right lumpectomy 02/05/2021 for ductal carcinoma in situ measuring 1.6 cm, grade 2, with negative margins.  (3) anastrozole started 01/17/2021   PLAN: Berda is now 4 months out from definitive surgery for her breast cancer.  She is tolerating  anastrozole well and the plan will be to continue that a total of 5 years.  I have added a bone density to her mammography next June.  I encouraged her to take her vitamin D every day.  I expressed our sympathy in the sudden death of her husband.  She knows to call for any other issue that may develop before the next visit  Total encounter time 20 minutes.Sarajane Jews C. Skyler Carel, MD 05/15/2021 11:26 AM Medical Oncology and Hematology [facility] [place], [place] [address] Tel. [phone]    Fax. [phone]   This document serves as a record of services personally performed by Lurline Del, MD. It was created on his behalf by Wilburn Mylar, a trained medical scribe. The creation of this record is based on the scribe's personal observations and the provider's statements to them.   I, Lurline Del MD, have reviewed the above documentation for accuracy and completeness, and I agree with the above.   *Total Encounter Time as defined by the Centers for Medicare and Medicaid Services includes, in addition to the face-to-face time of a patient visit (documented in the note above) non-face-to-face time: obtaining and reviewing outside history, ordering and reviewing medications, tests or procedures, care coordination (communications with other health care professionals or caregivers) and documentation in the medical record.

## 2021-05-15 ENCOUNTER — Other Ambulatory Visit: Payer: Self-pay

## 2021-05-15 ENCOUNTER — Ambulatory Visit: Payer: Medicare Other | Admitting: Oncology

## 2021-05-15 ENCOUNTER — Inpatient Hospital Stay: Payer: Medicare Other | Attending: Oncology | Admitting: Oncology

## 2021-05-15 VITALS — BP 150/76 | HR 79 | Temp 97.7°F | Resp 16 | Ht 64.0 in | Wt 166.3 lb

## 2021-05-15 DIAGNOSIS — Z170 Estrogen receptor positive status [ER+]: Secondary | ICD-10-CM | POA: Insufficient documentation

## 2021-05-15 DIAGNOSIS — M8008XA Age-related osteoporosis with current pathological fracture, vertebra(e), initial encounter for fracture: Secondary | ICD-10-CM

## 2021-05-15 DIAGNOSIS — Z800 Family history of malignant neoplasm of digestive organs: Secondary | ICD-10-CM | POA: Diagnosis not present

## 2021-05-15 DIAGNOSIS — R232 Flushing: Secondary | ICD-10-CM | POA: Diagnosis not present

## 2021-05-15 DIAGNOSIS — Z86018 Personal history of other benign neoplasm: Secondary | ICD-10-CM | POA: Insufficient documentation

## 2021-05-15 DIAGNOSIS — E039 Hypothyroidism, unspecified: Secondary | ICD-10-CM | POA: Diagnosis not present

## 2021-05-15 DIAGNOSIS — Z803 Family history of malignant neoplasm of breast: Secondary | ICD-10-CM | POA: Insufficient documentation

## 2021-05-15 DIAGNOSIS — Z79811 Long term (current) use of aromatase inhibitors: Secondary | ICD-10-CM | POA: Diagnosis not present

## 2021-05-15 DIAGNOSIS — Z801 Family history of malignant neoplasm of trachea, bronchus and lung: Secondary | ICD-10-CM | POA: Insufficient documentation

## 2021-05-15 DIAGNOSIS — D0511 Intraductal carcinoma in situ of right breast: Secondary | ICD-10-CM | POA: Diagnosis present

## 2021-05-15 MED ORDER — VITAMIN D3 50 MCG (2000 UT) PO TABS: 2000.0000 [IU] | ORAL_TABLET | Freq: Every morning | ORAL | 4 refills | Status: AC

## 2021-05-15 MED ORDER — ANASTROZOLE 1 MG PO TABS: 1.0000 mg | ORAL_TABLET | Freq: Every day | ORAL | 4 refills | Status: DC

## 2021-07-30 DIAGNOSIS — N9089 Other specified noninflammatory disorders of vulva and perineum: Secondary | ICD-10-CM | POA: Insufficient documentation

## 2021-08-14 ENCOUNTER — Other Ambulatory Visit: Payer: Self-pay | Admitting: Hematology and Oncology

## 2021-08-14 DIAGNOSIS — D0511 Intraductal carcinoma in situ of right breast: Secondary | ICD-10-CM

## 2021-09-18 ENCOUNTER — Telehealth: Payer: Self-pay | Admitting: Hematology and Oncology

## 2021-09-18 NOTE — Telephone Encounter (Signed)
Rescheduled appointment per providers template. Left message.  ? ?

## 2021-10-22 ENCOUNTER — Other Ambulatory Visit: Payer: Self-pay | Admitting: Adult Health

## 2021-10-22 DIAGNOSIS — D0511 Intraductal carcinoma in situ of right breast: Secondary | ICD-10-CM

## 2021-10-22 DIAGNOSIS — M8008XA Age-related osteoporosis with current pathological fracture, vertebra(e), initial encounter for fracture: Secondary | ICD-10-CM

## 2021-10-23 ENCOUNTER — Encounter (HOSPITAL_COMMUNITY): Payer: Self-pay

## 2021-10-28 ENCOUNTER — Ambulatory Visit
Admission: RE | Admit: 2021-10-28 | Discharge: 2021-10-28 | Disposition: A | Discharge status: Home / Self Care | Payer: Medicare Other | Source: Ambulatory Visit | Attending: Oncology | Admitting: Oncology

## 2021-10-28 DIAGNOSIS — M8008XA Age-related osteoporosis with current pathological fracture, vertebra(e), initial encounter for fracture: Secondary | ICD-10-CM

## 2021-10-28 DIAGNOSIS — D0511 Intraductal carcinoma in situ of right breast: Secondary | ICD-10-CM

## 2021-10-29 ENCOUNTER — Telehealth: Payer: Self-pay

## 2021-10-29 NOTE — Telephone Encounter (Signed)
-----   Message from Gardenia Phlegm, NP sent at 10/28/2021 12:10 PM EDT ----- ?Patient has mild osteopenia.  This is great news considering her age her bones look really good. ?----- Message ----- ?From: Interface, Rad Results In ?Sent: 10/28/2021  11:59 AM EDT ?To: Gardenia Phlegm, NP ? ? ?

## 2021-10-29 NOTE — Telephone Encounter (Signed)
Attempt to contact pt regarding bone density, unable to leave VM ?

## 2021-12-12 ENCOUNTER — Ambulatory Visit
Admission: RE | Admit: 2021-12-12 | Discharge: 2021-12-12 | Disposition: A | Discharge status: Home / Self Care | Payer: Medicare Other | Source: Ambulatory Visit | Attending: Hematology and Oncology | Admitting: Hematology and Oncology

## 2021-12-12 DIAGNOSIS — D0511 Intraductal carcinoma in situ of right breast: Secondary | ICD-10-CM

## 2021-12-17 ENCOUNTER — Ambulatory Visit: Payer: Medicare Other | Admitting: Hematology and Oncology

## 2021-12-31 ENCOUNTER — Inpatient Hospital Stay: Payer: Medicare Other | Attending: Hematology and Oncology | Admitting: Hematology and Oncology

## 2021-12-31 ENCOUNTER — Encounter: Payer: Self-pay | Admitting: Hematology and Oncology

## 2021-12-31 ENCOUNTER — Other Ambulatory Visit: Payer: Self-pay

## 2021-12-31 VITALS — BP 153/81 | HR 81 | Temp 97.5°F | Resp 16 | Ht 64.0 in | Wt 165.8 lb

## 2021-12-31 DIAGNOSIS — Z170 Estrogen receptor positive status [ER+]: Secondary | ICD-10-CM | POA: Insufficient documentation

## 2021-12-31 DIAGNOSIS — Z79811 Long term (current) use of aromatase inhibitors: Secondary | ICD-10-CM | POA: Diagnosis not present

## 2021-12-31 DIAGNOSIS — D0511 Intraductal carcinoma in situ of right breast: Secondary | ICD-10-CM | POA: Diagnosis not present

## 2021-12-31 MED ORDER — ANASTROZOLE 1 MG PO TABS: 1.0000 mg | ORAL_TABLET | Freq: Every day | ORAL | 4 refills | Status: DC

## 2021-12-31 NOTE — Progress Notes (Signed)
[place]  [phone] [phone]     ID: Kelly Friedman DOB: Aug 28, 1934  MR#: [id number]  ERD#:[id number]  Patient Care Team: Kelly Frees, MD as PCP - General (Family Medicine) Kelly Germany, RN as Oncology Nurse Navigator Kelly Kaufmann, RN as Oncology Nurse Navigator Magrinat, Virgie Dad, MD (Inactive) as Consulting Physician (Oncology) Kelly Klein, MD as Consulting Physician (General Surgery) Kelly Rudd, MD as Consulting Physician (Radiation Oncology) Kelly Pearson, MD as Consulting Physician (Obstetrics and Gynecology) Kelly Friedman, DPM as Referring Physician (Podiatry) Kelly Skinner, MD as Consulting Physician (Ophthalmology) Kelly Huntsman, MD as Referring Physician (Orthopedic Surgery) Kelly Levans, MD as Referring Physician (Dermatology) Kelly Pike, MD OTHER MD:  CHIEF COMPLAINT: Estrogen receptor positive noninvasive breast cancer  CURRENT TREATMENT: anastrozole   INTERVAL HISTORY: Kelly Friedman returns today for follow up of her noninvasive breast cancer. Patient is tolerating anastrozole very well, she does have some intermittent nocturnal hot flashes but they are tolerable.  She otherwise has not noticed any changes in her breast.  She has been taking calcium and vitamin D supplementation as recommended.  She used to take Fosamax many years ago and is not keen to start it again. Rest of the pertinent 10 point ROS reviewed and negative   COVID 19 VACCINATION STATUS: Pfizer x3   HISTORY OF CURRENT ILLNESS: From the original intake note:  Kelly Friedman had routine screening mammography on 12/11/2020 showing a possible abnormality in the right breast. She underwent right diagnostic mammography with tomography and right breast ultrasonography at [facility] on 01/03/2021 showing: breast density category C; persistent focal distortion within superior right breast without sonographic  correlate.  Accordingly on 01/04/2021 she proceeded to biopsy of the right breast area in question. The pathology from this procedure ([id number]) showed: ductal carcinoma in situ with calcifications involving a complex sclerosing lesion. Prognostic indicators significant for: estrogen receptor, 95% positive and progesterone receptor, 1% positive, both with strong staining intensity.    Cancer Staging  Ductal carcinoma in situ (DCIS) of right breast Staging form: Breast, AJCC 8th Edition - Clinical stage from 01/16/2021: Stage 0 (cTis (DCIS), cN0, cM0, ER+, PR+) - Signed by Kelly Cruel, MD on 01/16/2021 Stage prefix: Initial diagnosis Nuclear grade: G3   The patient's subsequent history is as detailed below.   PAST MEDICAL HISTORY: Past Medical History:  Diagnosis Date   Anemia    Arthritis    Breast cancer ([place])    Hypothyroidism    Pituitary adenoma ([place]) 10/05/2012   Pre-diabetes    Thyroid disease     PAST SURGICAL HISTORY: Past Surgical History:  Procedure Laterality Date   APPENDECTOMY     BREAST BIOPSY Right 01/04/2021   BREAST LUMPECTOMY Right 02/05/2021   BREAST LUMPECTOMY WITH RADIOACTIVE SEED LOCALIZATION Right 02/05/2021   Procedure: RIGHT BREAST LUMPECTOMY WITH RADIOACTIVE SEED LOCALIZATION;  Surgeon: Kelly Klein, MD;  Location: [facility];  Service: General;  Laterality: Right;   CATARACT EXTRACTION     DILATION AND CURETTAGE OF UTERUS     EYE SURGERY     HAMMER TOE SURGERY Right    mole removed  2013 and 2006   TUBAL LIGATION      FAMILY HISTORY: Family History  Problem Relation Age of Onset   Breast cancer Sister    Pancreatic cancer Sister    Breast cancer Sister    Lung cancer Sister    Prostate cancer Brother    Stomach cancer Paternal Grandmother  Her father died at age 5 from cirrhosis (alcohol-induced). Her mother died at age 76. Kelly Friedman has two brothers and four sisters. She reports breast cancer in two sisters, one at age 20, who was also  diagnosed with pancreatic cancer, and the other at age 30. She also reports lung cancer in a sister at age 41, prostate cancer in a brother at age 40, and stomach cancer in her paternal grandmother.   GYNECOLOGIC HISTORY:  No LMP recorded. Patient is postmenopausal. Menarche: 87 years old Age at first live birth: 87 years old Kelly Friedman 2 LMP 1995 Contraceptive never used HRT never used  Hysterectomy? no BSO? 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 Husband Kelly Friedman was a retired Librarian, academic.  He died from pneumonia at home in 2022. Daughter Kelly Friedman, age 38, is a group Gaffer here in [place]. Son Kelly Friedman, age 102, is a Curator in [place], [place]. Kelly Friedman has three grandchildren, aged 40, 58, and 60. She has no great-grandchildren. She attends a [employer].    ADVANCED DIRECTIVES: To be determined   HEALTH MAINTENANCE: Social History   Tobacco Use   Smoking status: Never   Smokeless tobacco: Never  Vaping Use   Vaping Use: Never used  Substance Use Topics   Alcohol use: Yes    Comment: 1 month   Drug use: No     Colonoscopy: 2012 (with [facility])  PAP: 2017  Bone density: 12/2019   No Known Allergies  Current Outpatient Medications  Medication Sig Dispense Refill   acetaminophen (TYLENOL) 500 MG tablet Take 500-1,000 mg by mouth every 6 (six) hours as needed (pain).     anastrozole (ARIMIDEX) 1 MG tablet Take 1 tablet (1 mg total) by mouth daily. 90 tablet 4   Ascorbic Acid (VITAMIN C) 1000 MG tablet Take 1,000 mg by mouth in the morning.     Biotin 10000 MCG TBDP Take 10,000 mcg by mouth in the morning.     Boswellia-Glucosamine-Vit D (OSTEO BI-FLEX ONE PER DAY) TABS Take 1 tablet by mouth in the morning. W/Turmeric     brimonidine (ALPHAGAN) 0.2 % ophthalmic solution Place 1 drop into both eyes in the morning and at bedtime.     Cholecalciferol (VITAMIN D3) 50 MCG  (2000 UT) TABS Take 2,000 Units by mouth in the morning. 90 tablet 4   ELDERBERRY PO Take 2 tablets by mouth in the morning. Elderberry Gummies     fluticasone (FLONASE) 50 MCG/ACT nasal spray Place 1 spray into both nostrils daily as needed for allergies or rhinitis.     levothyroxine (SYNTHROID) 25 MCG tablet Take 25 mcg by mouth every morning.     meclizine (ANTIVERT) 25 MG tablet Take 25 mg by mouth 3 (three) times daily as needed (vertigo).     Multiple Vitamin (MULTIVITAMIN WITH MINERALS) TABS tablet Take 1 tablet by mouth daily. Women's Multivitamin     oxyCODONE (OXY IR/ROXICODONE) 5 MG immediate release tablet Take 1 tablet (5 mg total) by mouth every 6 (six) hours as needed for severe pain. 8 tablet 0   psyllium (METAMUCIL SMOOTH TEXTURE) 28 % packet Take 1 packet by mouth daily as needed (digestive regularity).     Travoprost, BAK Free, (TRAVATAN) 0.004 % SOLN ophthalmic solution Place 1 drop into both eyes at bedtime.     Zinc 50 MG TABS Take 50 mg by mouth in the morning.  No current facility-administered medications for this visit.    OBJECTIVE: African-American woman who appears younger than stated age  58:   12/31/21 1338  BP: (!) 153/81  Pulse: 81  Resp: 16  Temp: (!) 97.5 F (36.4 C)  SpO2: 100%     Body mass index is 28.46 kg/m.   Wt Readings from Last 3 Encounters:  12/31/21 165 lb 12.8 oz (75.2 kg)  05/15/21 166 lb 4.8 oz (75.4 kg)  02/05/21 164 lb (74.4 kg)      ECOG FS:1 - Symptomatic but completely ambulatory  Physical Exam Constitutional:      Appearance: Normal appearance.  Chest:     Comments: Bilateral breasts inspected.  Postsurgical changes appreciated in the right breast.  No palpable masses or regional adenopathy.  Left breast normal to inspection and palpation Musculoskeletal:     Cervical back: Normal range of motion and neck supple. No rigidity.  Lymphadenopathy:     Cervical: No cervical adenopathy.  Neurological:     Mental  Status: She is alert.       LAB RESULTS:  CMP     Component Value Date/Time   NA 141 01/29/2021 1500   K 3.6 01/29/2021 1500   CL 103 01/29/2021 1500   CO2 30 01/29/2021 1500   GLUCOSE 116 (H) 01/29/2021 1500   BUN 26 (H) 01/29/2021 1500   CREATININE 1.02 (H) 01/29/2021 1500   CREATININE 0.90 01/16/2021 0819   CALCIUM 9.4 01/29/2021 1500   PROT 6.8 01/16/2021 0819   ALBUMIN 3.2 (L) 01/16/2021 0819   AST 20 01/16/2021 0819   ALT 18 01/16/2021 0819   ALKPHOS 83 01/16/2021 0819   BILITOT 0.3 01/16/2021 0819   GFRNONAA 55 (L) 01/29/2021 1500   GFRNONAA >60 01/16/2021 0819    No results found for: "TOTALPROTELP", "ALBUMINELP", "A1GS", "A2GS", "BETS", "BETA2SER", "GAMS", "MSPIKE", "SPEI"  Lab Results  Component Value Date   WBC 4.5 01/29/2021   NEUTROABS 1.9 01/16/2021   HGB 12.9 01/29/2021   HCT 39.7 01/29/2021   MCV 89.6 01/29/2021   PLT 159 01/29/2021    No results found for: "LABCA2"  No components found for: "OMVEHM094"  No results for input(s): "INR" in the last 168 hours.  No results found for: "LABCA2"  No results found for: "BSJ628"  No results found for: "CAN125"  No results found for: "CAN153"  No results found for: "CA2729"  No components found for: "HGQUANT"  No results found for: "CEA1", "CEA" / No results found for: "CEA1", "CEA"   No results found for: "AFPTUMOR"  No results found for: "CHROMOGRNA"  No results found for: "KPAFRELGTCHN", "LAMBDASER", "KAPLAMBRATIO" (kappa/lambda light chains)  No results found for: "HGBA", "HGBA2QUANT", "HGBFQUANT", "HGBSQUAN" (Hemoglobinopathy evaluation)   No results found for: "LDH"  No results found for: "IRON", "TIBC", "IRONPCTSAT" (Iron and TIBC)  No results found for: "FERRITIN"  Urinalysis No results found for: "COLORURINE", "APPEARANCEUR", "LABSPEC", "PHURINE", "GLUCOSEU", "HGBUR", "BILIRUBINUR", "KETONESUR", "PROTEINUR", "UROBILINOGEN", "NITRITE", "LEUKOCYTESUR"   STUDIES: MM DIAG  BREAST TOMO BILATERAL  Result Date: 12/12/2021 CLINICAL DATA:  History of RIGHT breast cancer status post lumpectomy in 2022. EXAM: DIGITAL DIAGNOSTIC BILATERAL MAMMOGRAM WITH TOMOSYNTHESIS AND CAD TECHNIQUE: Bilateral digital diagnostic mammography and breast tomosynthesis was performed. The images were evaluated with computer-aided detection. COMPARISON:  Previous exam(s). ACR Breast Density Category b: There are scattered areas of fibroglandular density. FINDINGS: There are expected postsurgical changes within the RIGHT breast. There are no new dominant masses, suspicious calcifications or secondary signs of malignancy  within either breast. IMPRESSION: No evidence of malignancy within either breast. Expected postsurgical changes within the RIGHT breast. RECOMMENDATION: Bilateral diagnostic mammogram in 1 year. I have discussed the findings and recommendations with the patient. If applicable, a reminder letter will be sent to the patient regarding the next appointment. BI-RADS CATEGORY  2: Benign. Electronically Signed   By: Franki Cabot M.D.   On: 12/12/2021 15:50    ELIGIBLE FOR AVAILABLE RESEARCH PROTOCOL: no  ASSESSMENT: 87 y.o. Kelly Friedman woman status post right breast biopsy 01/04/2021 for ductal carcinoma in situ, estrogen and progesterone receptor positive  (1) genetics testing (A) Negative hereditary cancer genetic testing: no pathogenic variants detected in [facility] Panel.  Variants of uncertain significance detected in AXIN2 at c.1235A>G (Friedman.Asn412Ser) and RNF43 at c.2054C>A (Friedman.[id number]).  The report date is February 10, 2019.    The [facility] gene panel offered by [employer] includes sequencing and deletion/duplication testing of the following 35 genes: APC, ATM, AXIN2, BARD1, BMPR1A, BRCA1, BRCA2, BRIP1, CHD1, CDK4, CDKN2A, CHEK2, EPCAM (large rearrangement only), HOXB13, (sequencing only), GALNT12, MLH1, MSH2, MSH3 (excluding repetitive portions of exon 1), MSH6, MUTYH,  NBN, NTHL1, PALB2, PMS2, PTEN, RAD51C, RAD51D, RNF43, RPS20, SMAD4, STK11, and TP53. Sequencing was performed for select regions of POLE and POLD1, and large rearrangement analysis was performed for select regions of GREM1.  (2) status post right lumpectomy 02/05/2021 for ductal carcinoma in situ measuring 1.6 cm, grade 2, with negative margins.  (3) anastrozole started 01/17/2021   PLAN:  Ms. Jenavieve is here for follow-up on anastrozole.   Since last visit she had a mammogram on December 12, 2021 which showed no evidence of malignancy with in either breast. She had bone density done on 417 which showed a T score of -1.3, considered osteopenia. She was taking fosamax many yrs ago. She continues calcium and Vit D supplements. At this time there is no concern for breast cancer recurrence.  She will continue anastrozole till July 2027. We have once again discussed about Fosamax but she is not interested.  She was encouraged to try some weightbearing exercises. She will return to clinic in 6 months or sooner as needed.  *Total Encounter Time as defined by the Centers for Medicare and Medicaid Services includes, in addition to the face-to-face time of a patient visit (documented in the note above) non-face-to-face time: obtaining and reviewing outside history, ordering and reviewing medications, tests or procedures, care coordination (communications with other health care professionals or caregivers) and documentation in the medical record.

## 2022-05-27 IMAGING — MG MM PLC BREAST LOC DEV 1ST LESION INC*R*
6 series · 6 of 6 positions shown · non-contrast
Comparison: Previous exam(s).

CLINICAL DATA: Localization prior to surgery.

EXAM:
MAMMOGRAPHIC GUIDED RADIOACTIVE SEED LOCALIZATION OF THE RIGHT
BREAST

[R CC (1 of 4)]
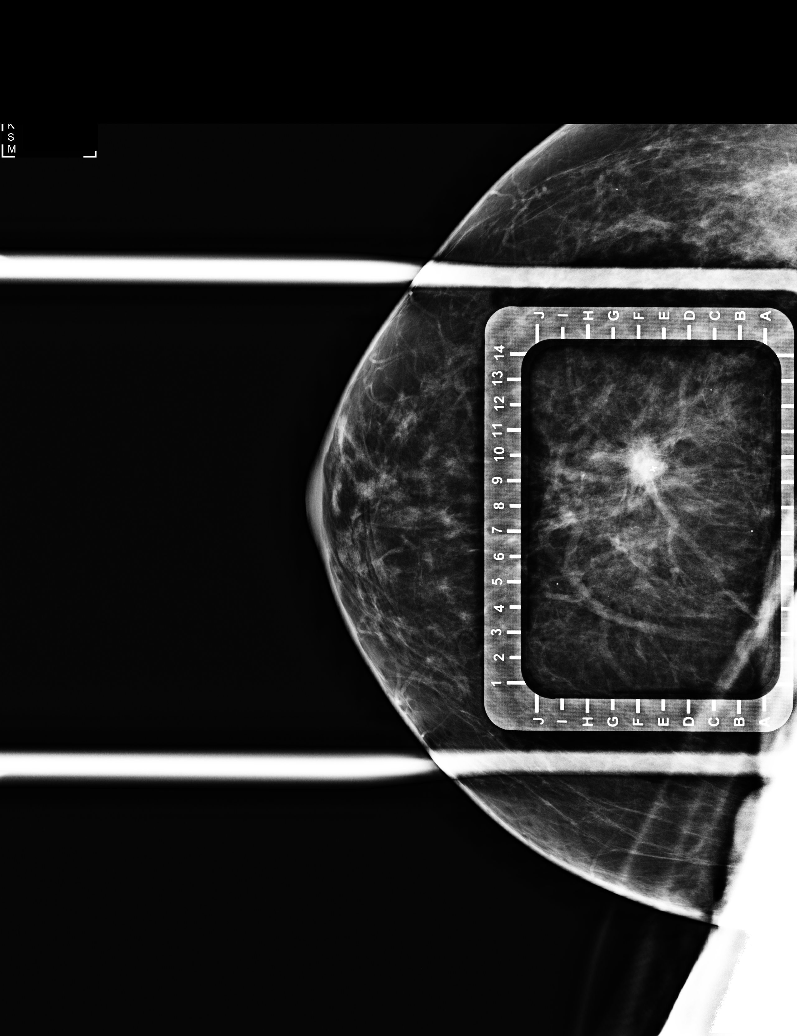

[R CC (2 of 4)]
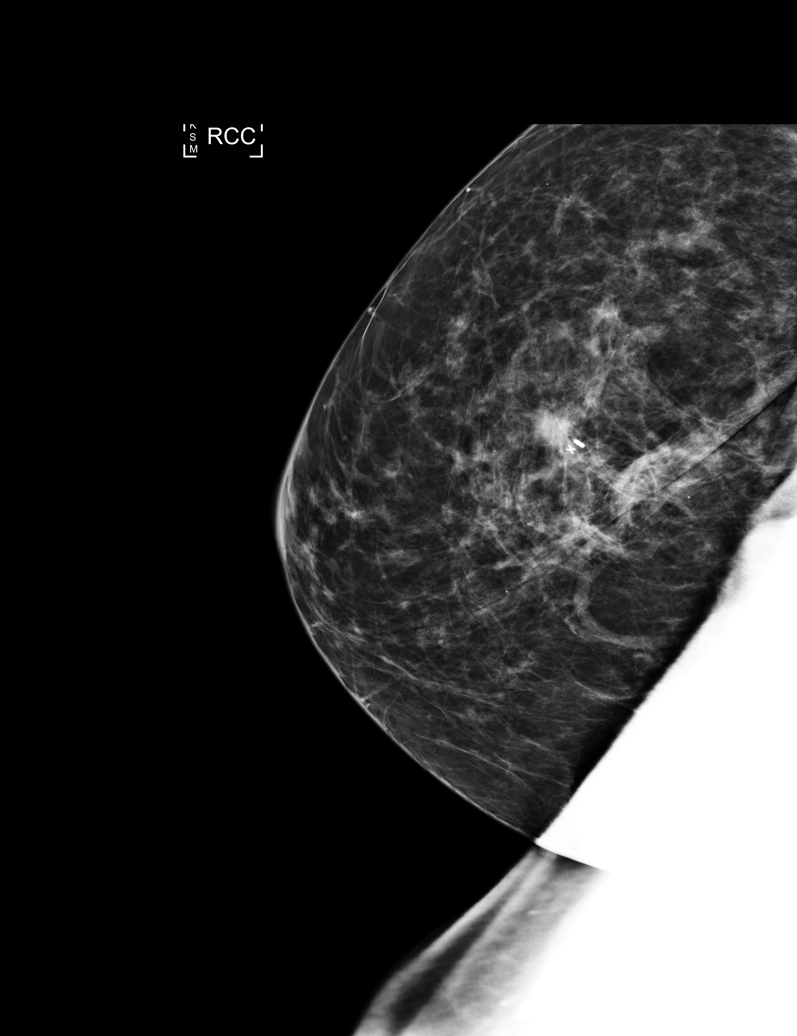

[R CC (3 of 4)]
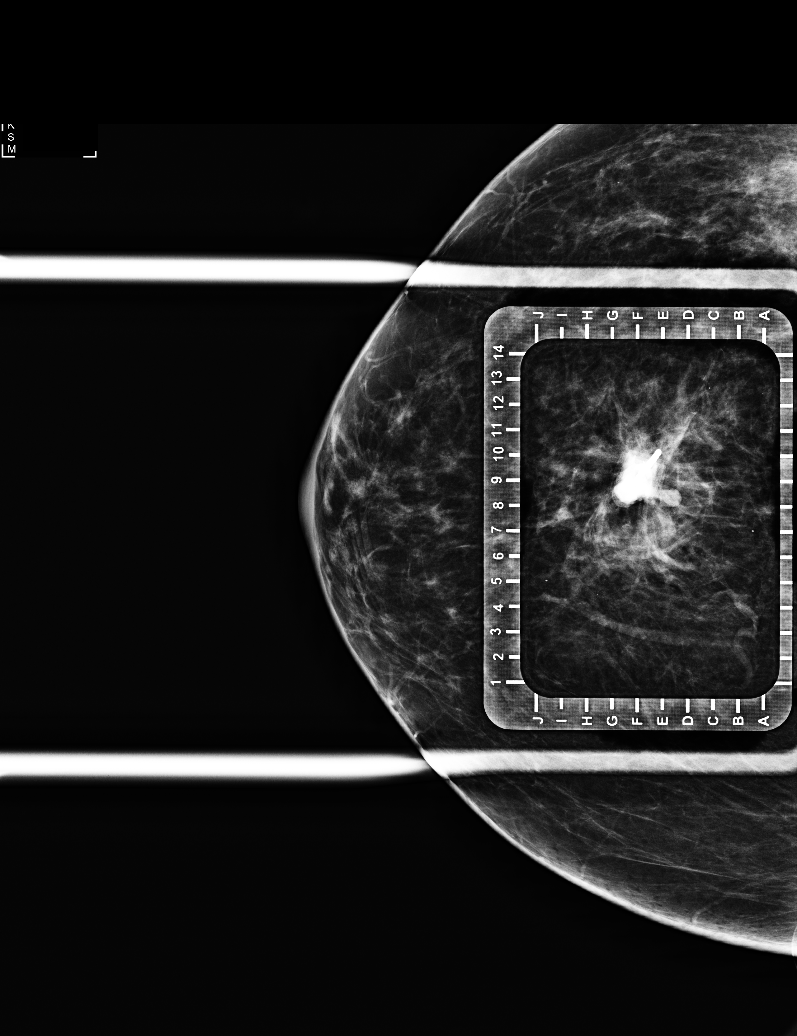

[R CC (4 of 4)]
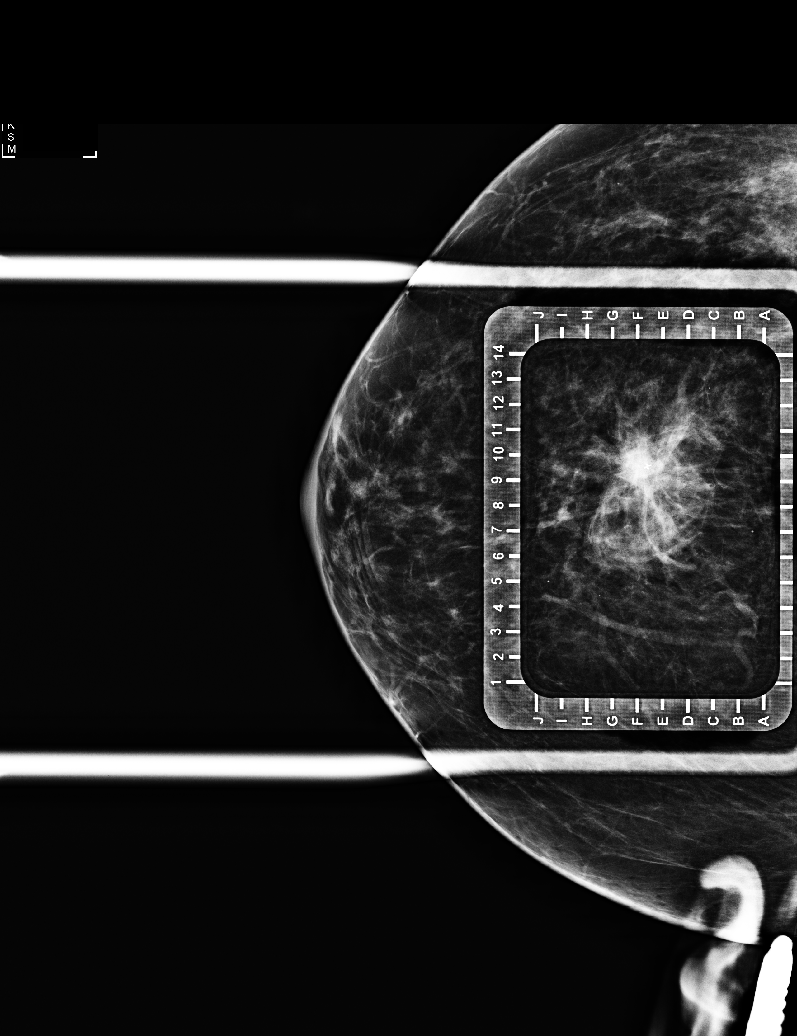

[R ML (1 of 2)]
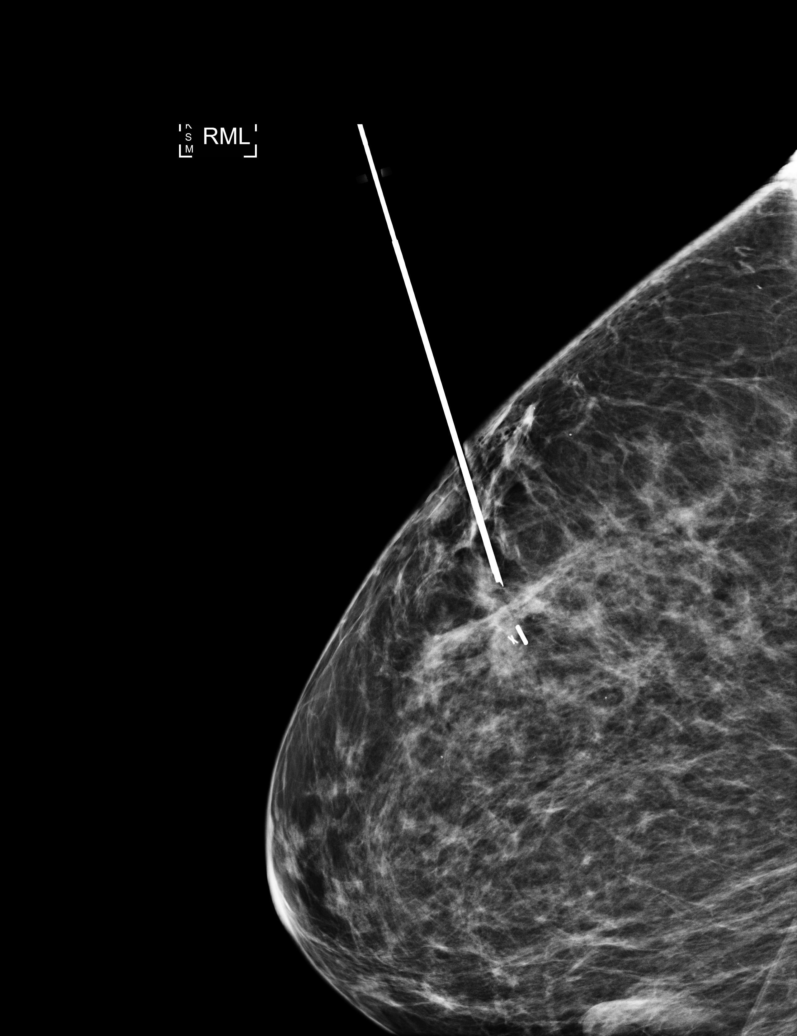

[R ML (2 of 2)]
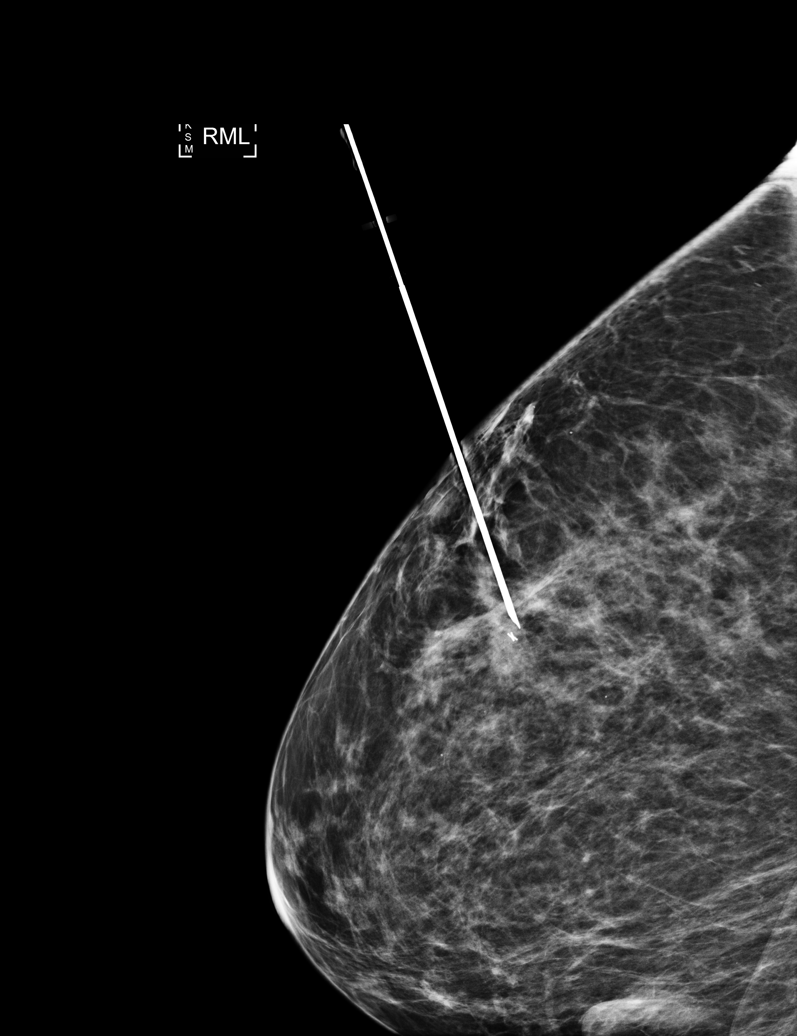

[6 of 6 positions shown; findings below may reference images not displayed]



The usual time-out protocol was performed immediately prior to the
procedure.

Using mammographic guidance, sterile technique, 1% lidocaine and an
7-ND2 radioactive seed, the previously placed biopsy clip was
localized using a superior approach. The follow-up mammogram images
confirm the seed in the expected location and were marked for the
surgeon.

Follow-up survey of the patient confirms presence of the radioactive
seed.

Order number of 7-ND2 seed:  [id number].

Total activity:  0.253 millicuries reference Date: January 23, 2021

The patient tolerated the procedure well and was released from the
[REDACTED]. She was given instructions regarding seed removal.
IMPRESSION: Radioactive seed localization right breast. No apparent
complications.

## 2022-05-28 IMAGING — MG MM BREAST SURGICAL SPECIMEN
1 series · 1 of 1 positions shown · non-contrast
Comparison: Previous exam(s).

CLINICAL DATA: 85-year-old with biopsy-proven DCIS involving a
complex sclerosing lesion in the RIGHT breast. Radioactive seed
localization was performed yesterday in anticipation of today's
lumpectomy.

EXAM:
SPECIMEN RADIOGRAPH OF THE RIGHT BREAST

[R]
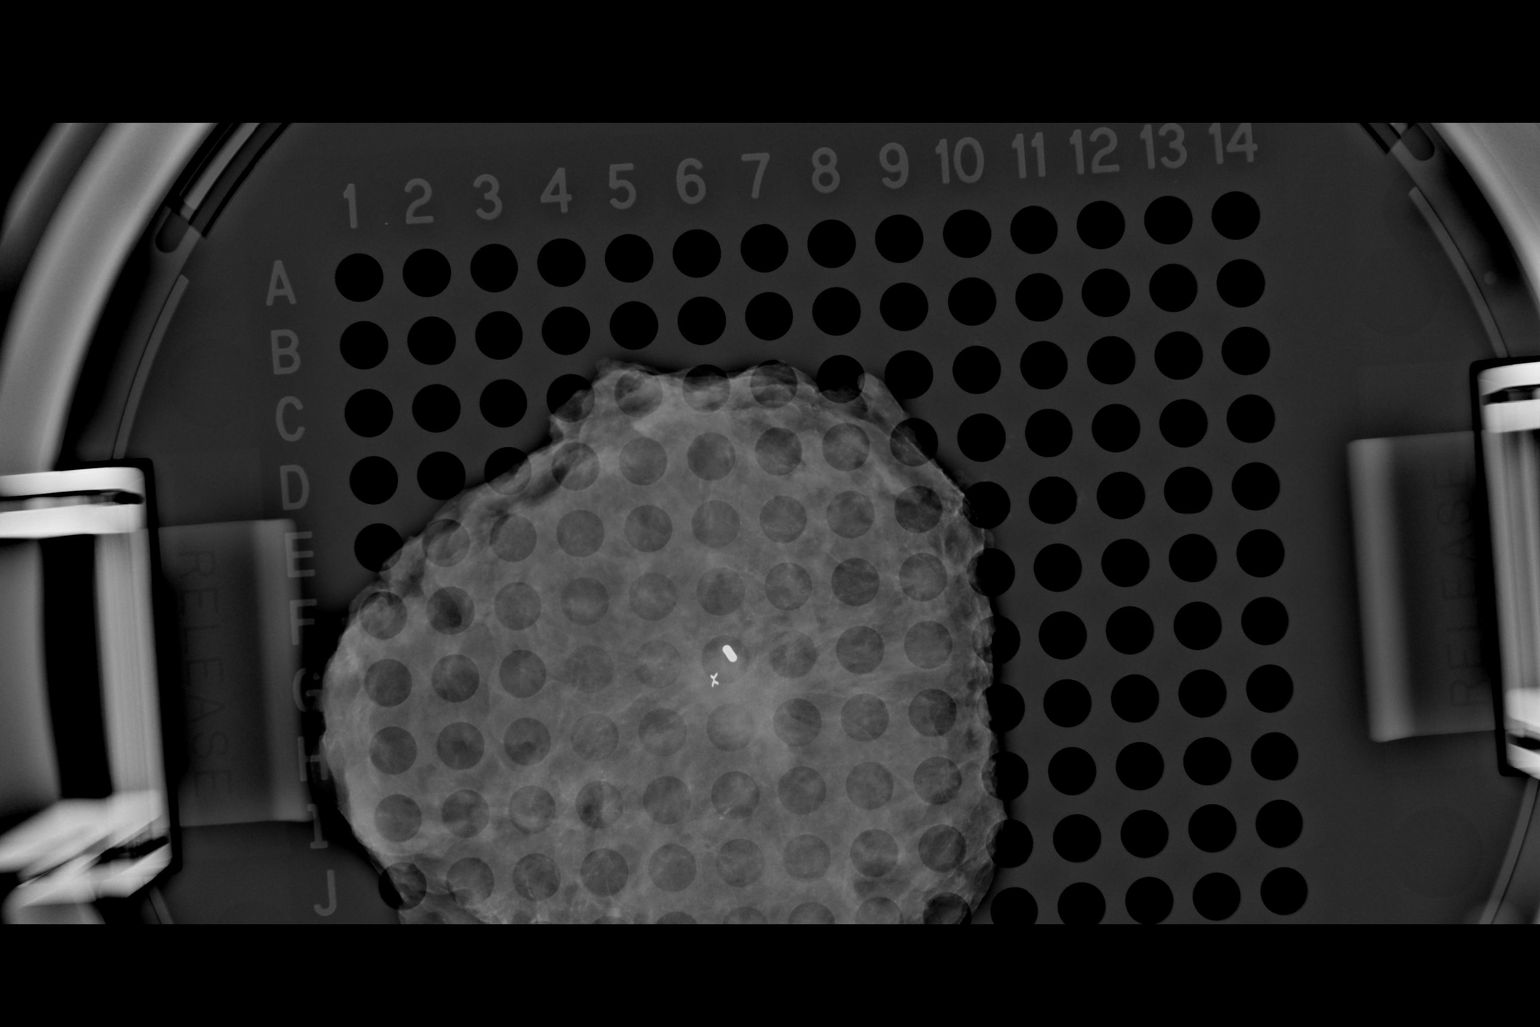

[1 of 1 positions shown; findings below may reference images not displayed]

FINDINGS: Status post excision of the RIGHT breast. The radioactive seed and X
shaped biopsy marker clip are present in the specimen, completely
intact, and are marked for pathology. This was discussed directly
with the operating room nurse at the time of interpretation on
IMPRESSION: Specimen radiograph of the RIGHT breast.

## 2022-07-01 ENCOUNTER — Encounter: Payer: Self-pay | Admitting: Hematology and Oncology

## 2022-07-01 ENCOUNTER — Other Ambulatory Visit: Payer: Self-pay

## 2022-07-01 ENCOUNTER — Inpatient Hospital Stay: Payer: Medicare Other | Attending: Hematology and Oncology | Admitting: Hematology and Oncology

## 2022-07-01 VITALS — BP 148/67 | HR 65 | Temp 99.3°F | Resp 16 | Wt 165.9 lb

## 2022-07-01 DIAGNOSIS — Z803 Family history of malignant neoplasm of breast: Secondary | ICD-10-CM | POA: Insufficient documentation

## 2022-07-01 DIAGNOSIS — Z801 Family history of malignant neoplasm of trachea, bronchus and lung: Secondary | ICD-10-CM | POA: Insufficient documentation

## 2022-07-01 DIAGNOSIS — D0511 Intraductal carcinoma in situ of right breast: Secondary | ICD-10-CM | POA: Diagnosis not present

## 2022-07-01 DIAGNOSIS — Z79811 Long term (current) use of aromatase inhibitors: Secondary | ICD-10-CM | POA: Insufficient documentation

## 2022-07-01 DIAGNOSIS — Z800 Family history of malignant neoplasm of digestive organs: Secondary | ICD-10-CM | POA: Insufficient documentation

## 2022-07-01 NOTE — Progress Notes (Signed)
[place]  [phone] [phone]     ID: DARLYN REPSHER DOB: 05-24-1935  MR#: [id number]  KWI#:[id number]  Patient Care Team: Shirline Frees, MD as PCP - General (Family Medicine) Rockwell Germany, RN as Oncology Nurse Navigator Mauro Kaufmann, RN as Oncology Nurse Navigator Magrinat, Virgie Dad, MD (Inactive) as Consulting Physician (Oncology) Stark Klein, MD as Consulting Physician (General Surgery) Kyung Rudd, MD as Consulting Physician (Radiation Oncology) Marylynn Pearson, MD as Consulting Physician (Obstetrics and Gynecology) Dial, Sherrie George, DPM as Referring Physician (Podiatry) Lonia Skinner, MD as Consulting Physician (Ophthalmology) Darrick Huntsman, MD as Referring Physician (Orthopedic Surgery) Sydnee Levans, MD as Referring Physician (Dermatology) Benay Pike, MD OTHER MD:  CHIEF COMPLAINT: Estrogen receptor positive noninvasive breast cancer  CURRENT TREATMENT: anastrozole  INTERVAL HISTORY:  Jody returns today for follow up of her noninvasive breast cancer. Patient is tolerating anastrozole very well, she continues to have some hot flashes at night, not bothersome. She has been exercising 4 days of water aerobics. She has been taking calcium and vitamin D supplementation as recommended.  She just had her covid booster in left arm a week ago. No interim hospitalizations. No change in medications. Rest of the pertinent 10 point ROS reviewed and negative   COVID 19 VACCINATION STATUS: Pfizer x3   HISTORY OF CURRENT ILLNESS: From the original intake note:  Kelly Friedman had routine screening mammography on 12/11/2020 showing a possible abnormality in the right breast. She underwent right diagnostic mammography with tomography and right breast ultrasonography at [facility] on 01/03/2021 showing: breast density category C; persistent focal distortion within superior right breast without sonographic  correlate.  Accordingly on 01/04/2021 she proceeded to biopsy of the right breast area in question. The pathology from this procedure ([id number]) showed: ductal carcinoma in situ with calcifications involving a complex sclerosing lesion. Prognostic indicators significant for: estrogen receptor, 95% positive and progesterone receptor, 1% positive, both with strong staining intensity.    Cancer Staging  Ductal carcinoma in situ (DCIS) of right breast Staging form: Breast, AJCC 8th Edition - Clinical stage from 01/16/2021: Stage 0 (cTis (DCIS), cN0, cM0, ER+, PR+) - Signed by Chauncey Cruel, MD on 01/16/2021 Stage prefix: Initial diagnosis Nuclear grade: G3   The patient's subsequent history is as detailed below.   PAST MEDICAL HISTORY: Past Medical History:  Diagnosis Date   Anemia    Arthritis    Breast cancer ([place])    Hypothyroidism    Pituitary adenoma ([place]) 10/05/2012   Pre-diabetes    Thyroid disease     PAST SURGICAL HISTORY: Past Surgical History:  Procedure Laterality Date   APPENDECTOMY     BREAST BIOPSY Right 01/04/2021   BREAST LUMPECTOMY Right 02/05/2021   BREAST LUMPECTOMY WITH RADIOACTIVE SEED LOCALIZATION Right 02/05/2021   Procedure: RIGHT BREAST LUMPECTOMY WITH RADIOACTIVE SEED LOCALIZATION;  Surgeon: Stark Klein, MD;  Location: [facility];  Service: General;  Laterality: Right;   CATARACT EXTRACTION     DILATION AND CURETTAGE OF UTERUS     EYE SURGERY     HAMMER TOE SURGERY Right    mole removed  2013 and 2006   TUBAL LIGATION      FAMILY HISTORY: Family History  Problem Relation Age of Onset   Breast cancer Sister    Pancreatic cancer Sister    Breast cancer Sister    Lung cancer Sister    Prostate cancer Brother    Stomach cancer Paternal Grandmother  Her father died at age 5 from cirrhosis (alcohol-induced). Her mother died at age 76. Reeshemah has two brothers and four sisters. She reports breast cancer in two sisters, one at age 20, who was also  diagnosed with pancreatic cancer, and the other at age 30. She also reports lung cancer in a sister at age 41, prostate cancer in a brother at age 40, and stomach cancer in her paternal grandmother.   GYNECOLOGIC HISTORY:  No LMP recorded. Patient is postmenopausal. Menarche: 87 years old Age at first live birth: 87 years old [place] P 2 LMP 1995 Contraceptive never used HRT never used  Hysterectomy? no BSO? 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 Husband Edison Nasuti was a retired Librarian, academic.  He died from pneumonia at home in 2022. Daughter Gilberto Better, age 38, is a group Gaffer here in [place]. Son Marylyn Ishihara, age 102, is a Curator in [place], [place]. Dayanara has three grandchildren, aged 40, 58, and 60. She has no great-grandchildren. She attends a [employer], [employer] church.    ADVANCED DIRECTIVES: To be determined   HEALTH MAINTENANCE: Social History   Tobacco Use   Smoking status: Never   Smokeless tobacco: Never  Vaping Use   Vaping Use: Never used  Substance Use Topics   Alcohol use: Yes    Comment: 1 month   Drug use: No     Colonoscopy: 2012 (with [facility])  PAP: 2017  Bone density: 12/2019   No Known Allergies  Current Outpatient Medications  Medication Sig Dispense Refill   acetaminophen (TYLENOL) 500 MG tablet Take 500-1,000 mg by mouth every 6 (six) hours as needed (pain).     anastrozole (ARIMIDEX) 1 MG tablet Take 1 tablet (1 mg total) by mouth daily. 90 tablet 4   Ascorbic Acid (VITAMIN C) 1000 MG tablet Take 1,000 mg by mouth in the morning.     Biotin 10000 MCG TBDP Take 10,000 mcg by mouth in the morning.     Boswellia-Glucosamine-Vit D (OSTEO BI-FLEX ONE PER DAY) TABS Take 1 tablet by mouth in the morning. W/Turmeric     brimonidine (ALPHAGAN) 0.2 % ophthalmic solution Place 1 drop into both eyes in the morning and at bedtime.     Cholecalciferol (VITAMIN D3) 50 MCG  (2000 UT) TABS Take 2,000 Units by mouth in the morning. 90 tablet 4   ELDERBERRY PO Take 2 tablets by mouth in the morning. Elderberry Gummies     fluticasone (FLONASE) 50 MCG/ACT nasal spray Place 1 spray into both nostrils daily as needed for allergies or rhinitis.     levothyroxine (SYNTHROID) 25 MCG tablet Take 25 mcg by mouth every morning.     meclizine (ANTIVERT) 25 MG tablet Take 25 mg by mouth 3 (three) times daily as needed (vertigo).     Multiple Vitamin (MULTIVITAMIN WITH MINERALS) TABS tablet Take 1 tablet by mouth daily. Women's Multivitamin     oxyCODONE (OXY IR/ROXICODONE) 5 MG immediate release tablet Take 1 tablet (5 mg total) by mouth every 6 (six) hours as needed for severe pain. 8 tablet 0   psyllium (METAMUCIL SMOOTH TEXTURE) 28 % packet Take 1 packet by mouth daily as needed (digestive regularity).     Travoprost, BAK Free, (TRAVATAN) 0.004 % SOLN ophthalmic solution Place 1 drop into both eyes at bedtime.     Zinc 50 MG TABS Take 50 mg by mouth in the morning.  No current facility-administered medications for this visit.    OBJECTIVE: African-American woman who appears younger than stated age  67:   07/01/22 0921  BP: (!) 148/67  Pulse: 65  Resp: 16  Temp: 99.3 F (37.4 C)  SpO2: 100%     Body mass index is 28.48 kg/m.   Wt Readings from Last 3 Encounters:  07/01/22 165 lb 14.4 oz (75.3 kg)  12/31/21 165 lb 12.8 oz (75.2 kg)  05/15/21 166 lb 4.8 oz (75.4 kg)      ECOG FS:1 - Symptomatic but completely ambulatory  Physical Exam Constitutional:      Appearance: Normal appearance.  Chest:     Comments: Bilateral breasts inspected.  Palpable lump left breast/lower axilla at 1 0 clock position at the outer edge of the breast. No other palpable masses. Right breast normal to inspection and palpation. Musculoskeletal:     Cervical back: Normal range of motion and neck supple. No rigidity.  Lymphadenopathy:     Cervical: No cervical adenopathy.   Neurological:     Mental Status: She is alert.       LAB RESULTS:  CMP     Component Value Date/Time   NA 141 01/29/2021 1500   K 3.6 01/29/2021 1500   CL 103 01/29/2021 1500   CO2 30 01/29/2021 1500   GLUCOSE 116 (H) 01/29/2021 1500   BUN 26 (H) 01/29/2021 1500   CREATININE 1.02 (H) 01/29/2021 1500   CREATININE 0.90 01/16/2021 0819   CALCIUM 9.4 01/29/2021 1500   PROT 6.8 01/16/2021 0819   ALBUMIN 3.2 (L) 01/16/2021 0819   AST 20 01/16/2021 0819   ALT 18 01/16/2021 0819   ALKPHOS 83 01/16/2021 0819   BILITOT 0.3 01/16/2021 0819   GFRNONAA 55 (L) 01/29/2021 1500   GFRNONAA >60 01/16/2021 0819    No results found for: "TOTALPROTELP", "ALBUMINELP", "A1GS", "A2GS", "BETS", "BETA2SER", "GAMS", "MSPIKE", "SPEI"  Lab Results  Component Value Date   WBC 4.5 01/29/2021   NEUTROABS 1.9 01/16/2021   HGB 12.9 01/29/2021   HCT 39.7 01/29/2021   MCV 89.6 01/29/2021   PLT 159 01/29/2021    No results found for: "LABCA2"  No components found for: "SAYTKZ601"  No results for input(s): "INR" in the last 168 hours.  No results found for: "LABCA2"  No results found for: "UXN235"  No results found for: "CAN125"  No results found for: "CAN153"  No results found for: "CA2729"  No components found for: "HGQUANT"  No results found for: "CEA1", "CEA" / No results found for: "CEA1", "CEA"   No results found for: "AFPTUMOR"  No results found for: "CHROMOGRNA"  No results found for: "KPAFRELGTCHN", "LAMBDASER", "KAPLAMBRATIO" (kappa/lambda light chains)  No results found for: "HGBA", "HGBA2QUANT", "HGBFQUANT", "HGBSQUAN" (Hemoglobinopathy evaluation)   No results found for: "LDH"  No results found for: "IRON", "TIBC", "IRONPCTSAT" (Iron and TIBC)  No results found for: "FERRITIN"  Urinalysis No results found for: "COLORURINE", "APPEARANCEUR", "LABSPEC", "PHURINE", "GLUCOSEU", "HGBUR", "BILIRUBINUR", "KETONESUR", "PROTEINUR", "UROBILINOGEN", "NITRITE",  "LEUKOCYTESUR"   STUDIES: No results found.   ELIGIBLE FOR AVAILABLE RESEARCH PROTOCOL: no  ASSESSMENT: 87 y.o. [place] woman status post right breast biopsy 01/04/2021 for ductal carcinoma in situ, estrogen and progesterone receptor positive  (1) genetics testing (A) Negative hereditary cancer genetic testing: no pathogenic variants detected in [facility] Panel.  Variants of uncertain significance detected in AXIN2 at c.1235A>G (p.Asn412Ser) and RNF43 at c.2054C>A (p.Thr685Asn).  The report date is February 10, 2019.    The [facility]  gene panel offered by [employer] includes sequencing and deletion/duplication testing of the following 35 genes: APC, ATM, AXIN2, BARD1, BMPR1A, BRCA1, BRCA2, BRIP1, CHD1, CDK4, CDKN2A, CHEK2, EPCAM (large rearrangement only), HOXB13, (sequencing only), GALNT12, MLH1, MSH2, MSH3 (excluding repetitive portions of exon 1), MSH6, MUTYH, NBN, NTHL1, PALB2, PMS2, PTEN, RAD51C, RAD51D, RNF43, RPS20, SMAD4, STK11, and TP53. Sequencing was performed for select regions of POLE and POLD1, and large rearrangement analysis was performed for select regions of GREM1.  (2) status post right lumpectomy 02/05/2021 for ductal carcinoma in situ measuring 1.6 cm, grade 2, with negative margins.  (3) anastrozole started 01/17/2021   PLAN:  Ms. Sanoe is here for follow-up on anastrozole.   Since last visit she had a mammogram on December 12, 2021 which showed no evidence of malignancy with in either breast. She had bone density done on 417 which showed a T score of -1.3, considered osteopenia. She was taking fosamax many yrs ago. She continues calcium and Vit D supplements. On exam today, she had a palpable lump in the lower axilla or very outer edge of left breast. She just had a COVID booster a week ago in the same arm. No other concerns on breast exam We ordered a unilateral diag mammogram and [place] of that axilla. I will see you back in 6 weeks to review  results.  Total time: 30 min *Total Encounter Time as defined by the Centers for Medicare and Medicaid Services includes, in addition to the face-to-face time of a patient visit (documented in the note above) non-face-to-face time: obtaining and reviewing outside history, ordering and reviewing medications, tests or procedures, care coordination (communications with other health care professionals or caregivers) and documentation in the medical record.

## 2022-07-22 ENCOUNTER — Ambulatory Visit
Admission: RE | Admit: 2022-07-22 | Discharge: 2022-07-22 | Disposition: A | Discharge status: Home / Self Care | Payer: Medicare Other | Source: Ambulatory Visit | Attending: Hematology and Oncology | Admitting: Hematology and Oncology

## 2022-07-22 ENCOUNTER — Encounter: Payer: Self-pay | Admitting: Hematology and Oncology

## 2022-07-22 DIAGNOSIS — D0511 Intraductal carcinoma in situ of right breast: Secondary | ICD-10-CM

## 2022-07-29 ENCOUNTER — Ambulatory Visit: Payer: Medicare Other | Admitting: Hematology and Oncology

## 2022-07-29 ENCOUNTER — Other Ambulatory Visit: Payer: Self-pay | Admitting: Hematology and Oncology

## 2022-07-29 DIAGNOSIS — Z9889 Other specified postprocedural states: Secondary | ICD-10-CM

## 2022-08-12 ENCOUNTER — Encounter: Payer: Self-pay | Admitting: Hematology and Oncology

## 2022-08-12 ENCOUNTER — Inpatient Hospital Stay: Payer: Medicare Other | Attending: Hematology and Oncology | Admitting: Hematology and Oncology

## 2022-08-12 VITALS — BP 145/75 | HR 63 | Temp 97.9°F | Resp 16 | Ht 64.0 in | Wt 165.4 lb

## 2022-08-12 DIAGNOSIS — Z8379 Family history of other diseases of the digestive system: Secondary | ICD-10-CM | POA: Insufficient documentation

## 2022-08-12 DIAGNOSIS — Z9049 Acquired absence of other specified parts of digestive tract: Secondary | ICD-10-CM | POA: Insufficient documentation

## 2022-08-12 DIAGNOSIS — N6320 Unspecified lump in the left breast, unspecified quadrant: Secondary | ICD-10-CM | POA: Diagnosis not present

## 2022-08-12 DIAGNOSIS — Z79811 Long term (current) use of aromatase inhibitors: Secondary | ICD-10-CM | POA: Diagnosis not present

## 2022-08-12 DIAGNOSIS — Z8042 Family history of malignant neoplasm of prostate: Secondary | ICD-10-CM | POA: Insufficient documentation

## 2022-08-12 DIAGNOSIS — Z79899 Other long term (current) drug therapy: Secondary | ICD-10-CM | POA: Diagnosis not present

## 2022-08-12 DIAGNOSIS — N6489 Other specified disorders of breast: Secondary | ICD-10-CM | POA: Insufficient documentation

## 2022-08-12 DIAGNOSIS — Z86018 Personal history of other benign neoplasm: Secondary | ICD-10-CM | POA: Insufficient documentation

## 2022-08-12 DIAGNOSIS — Z800 Family history of malignant neoplasm of digestive organs: Secondary | ICD-10-CM | POA: Insufficient documentation

## 2022-08-12 DIAGNOSIS — Z803 Family history of malignant neoplasm of breast: Secondary | ICD-10-CM | POA: Diagnosis not present

## 2022-08-12 DIAGNOSIS — Z801 Family history of malignant neoplasm of trachea, bronchus and lung: Secondary | ICD-10-CM | POA: Insufficient documentation

## 2022-08-12 DIAGNOSIS — Z170 Estrogen receptor positive status [ER+]: Secondary | ICD-10-CM | POA: Insufficient documentation

## 2022-08-12 DIAGNOSIS — D0511 Intraductal carcinoma in situ of right breast: Secondary | ICD-10-CM | POA: Diagnosis not present

## 2022-08-12 NOTE — Progress Notes (Signed)
[facility]  [phone] [phone]     ID: Kelly Friedman DOB: 1935-02-04  MR#: [id number]  HKV#:[id number]  Patient Care Team: Shirline Frees, MD as PCP - General (Family Medicine) Rockwell Germany, RN as Oncology Nurse Navigator Mauro Kaufmann, RN as Oncology Nurse Navigator Magrinat, Virgie Dad, MD (Inactive) as Consulting Physician (Oncology) Stark Klein, MD as Consulting Physician (General Surgery) Kyung Rudd, MD as Consulting Physician (Radiation Oncology) Marylynn Pearson, MD as Consulting Physician (Obstetrics and Gynecology) Dial, Sherrie George, DPM as Referring Physician (Podiatry) Lonia Skinner, MD as Consulting Physician (Ophthalmology) Darrick Huntsman, MD as Referring Physician (Orthopedic Surgery) Sydnee Levans, MD as Referring Physician (Dermatology) Benay Pike, MD OTHER MD:  CHIEF COMPLAINT: Estrogen receptor positive noninvasive breast cancer  CURRENT TREATMENT: anastrozole  INTERVAL HISTORY:  Kelly Friedman returns today for follow up of her noninvasive breast cancer. Patient is tolerating anastrozole very well, Since last visit, she had a mammogram and [place] which were unremarkable for malignancy. She walks around 10000 steps a day and does water aerobics about 3/4 times a week. She has been taking elderberry for immunity No interim hospitalizations. No change in medications. Rest of the pertinent 10 point ROS reviewed and negative   COVID 19 VACCINATION STATUS: Pfizer x3   HISTORY OF CURRENT ILLNESS: From the original intake note:  Kelly Friedman had routine screening mammography on 12/11/2020 showing a possible abnormality in the right breast. She underwent right diagnostic mammography with tomography and right breast ultrasonography at [facility] on 01/03/2021 showing: breast density category C; persistent focal distortion within superior right breast without sonographic correlate.  Accordingly on 01/04/2021  she proceeded to biopsy of the right breast area in question. The pathology from this procedure ([id number]) showed: ductal carcinoma in situ with calcifications involving a complex sclerosing lesion. Prognostic indicators significant for: estrogen receptor, 95% positive and progesterone receptor, 1% positive, both with strong staining intensity.    Cancer Staging  Ductal carcinoma in situ (DCIS) of right breast Staging form: Breast, AJCC 8th Edition - Clinical stage from 01/16/2021: Stage 0 (cTis (DCIS), cN0, cM0, ER+, PR+) - Signed by Chauncey Cruel, MD on 01/16/2021 Stage prefix: Initial diagnosis Nuclear grade: G3   The patient's subsequent history is as detailed below.   PAST MEDICAL HISTORY: Past Medical History:  Diagnosis Date   Anemia    Arthritis    Breast cancer ([place])    Hypothyroidism    Pituitary adenoma ([place]) 10/05/2012   Pre-diabetes    Thyroid disease     PAST SURGICAL HISTORY: Past Surgical History:  Procedure Laterality Date   APPENDECTOMY     BREAST BIOPSY Right 01/04/2021   BREAST LUMPECTOMY Right 02/05/2021   BREAST LUMPECTOMY WITH RADIOACTIVE SEED LOCALIZATION Right 02/05/2021   Procedure: RIGHT BREAST LUMPECTOMY WITH RADIOACTIVE SEED LOCALIZATION;  Surgeon: Stark Klein, MD;  Location: [facility];  Service: General;  Laterality: Right;   CATARACT EXTRACTION     DILATION AND CURETTAGE OF UTERUS     EYE SURGERY     HAMMER TOE SURGERY Right    mole removed  2013 and 2006   TUBAL LIGATION      FAMILY HISTORY: Family History  Problem Relation Age of Onset   Breast cancer Sister    Pancreatic cancer Sister    Breast cancer Sister    Lung cancer Sister    Prostate cancer Brother    Stomach cancer Paternal Grandmother    Her father died at age  65 from cirrhosis (alcohol-induced). Her mother died at age 29. Kelly Friedman has two brothers and four sisters. She reports breast cancer in two sisters, one at age 58, who was also diagnosed with pancreatic cancer, and  the other at age 39. She also reports lung cancer in a sister at age 51, prostate cancer in a brother at age 27, and stomach cancer in her paternal grandmother.   GYNECOLOGIC HISTORY:  No LMP recorded. Patient is postmenopausal. Menarche: 88 years old Age at first live birth: 88 years old [place] P 2 LMP 1995 Contraceptive never used HRT never used  Hysterectomy? no BSO? 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 Husband Kelly Friedman was a retired Librarian, academic.  He died from pneumonia at home in 2022. Daughter Kelly Friedman, age 62, is a group Gaffer here in [place]. Son Kelly Friedman, age 66, is a Curator in [place], [place]. Kelly Friedman has three grandchildren, aged 1, 62, and 36. She has no great-grandchildren. She attends a [employer], [employer] church.    ADVANCED DIRECTIVES: To be determined   HEALTH MAINTENANCE: Social History   Tobacco Use   Smoking status: Never   Smokeless tobacco: Never  Vaping Use   Vaping Use: Never used  Substance Use Topics   Alcohol use: Yes    Comment: 1 month   Drug use: No     Colonoscopy: 2012 (with [facility])  PAP: 2017  Bone density: 12/2019   No Known Allergies  Current Outpatient Medications  Medication Sig Dispense Refill   anastrozole (ARIMIDEX) 1 MG tablet Take 1 tablet (1 mg total) by mouth daily. 90 tablet 4   Ascorbic Acid (VITAMIN C) 1000 MG tablet Take 1,000 mg by mouth in the morning.     Biotin 10000 MCG TBDP Take 10,000 mcg by mouth in the morning.     brimonidine (ALPHAGAN) 0.2 % ophthalmic solution Place 1 drop into both eyes in the morning and at bedtime.     Cholecalciferol (VITAMIN D3) 50 MCG (2000 UT) TABS Take 2,000 Units by mouth in the morning. 90 tablet 4   ELDERBERRY PO Take 2 tablets by mouth in the morning. Elderberry Gummies     fluticasone (FLONASE) 50 MCG/ACT nasal spray Place 1 spray into both nostrils daily as needed for allergies or  rhinitis.     levothyroxine (SYNTHROID) 25 MCG tablet Take 25 mcg by mouth every morning.     meclizine (ANTIVERT) 25 MG tablet Take 25 mg by mouth 3 (three) times daily as needed (vertigo).     Multiple Vitamin (MULTIVITAMIN WITH MINERALS) TABS tablet Take 1 tablet by mouth daily. Women's Multivitamin     psyllium (METAMUCIL SMOOTH TEXTURE) 28 % packet Take 1 packet by mouth daily as needed (digestive regularity).     Travoprost, BAK Free, (TRAVATAN) 0.004 % SOLN ophthalmic solution Place 1 drop into both eyes at bedtime.     Zinc 50 MG TABS Take 50 mg by mouth in the morning.     No current facility-administered medications for this visit.    OBJECTIVE: African-American woman who appears younger than stated age  There were no vitals filed for this visit.    There is no height or weight on file to calculate BMI.   Wt Readings from Last 3 Encounters:  07/01/22 165 lb 14.4 oz (75.3 kg)  12/31/21 165 lb 12.8 oz (75.2 kg)  05/15/21 166 lb 4.8 oz (75.4 kg)  ECOG FS:1 - Symptomatic but completely ambulatory  Physical Exam Constitutional:      Appearance: Normal appearance.  Chest:     Comments: Bilateral breasts inspected.  I still feel abnormal density in the left breast upper outer quadrant , but this was not mammographically concerning. She will have another mammogram in June  Musculoskeletal:     Cervical back: Normal range of motion and neck supple. No rigidity.  Lymphadenopathy:     Cervical: No cervical adenopathy.  Neurological:     Mental Status: She is alert.       LAB RESULTS:  CMP     Component Value Date/Time   NA 141 01/29/2021 1500   K 3.6 01/29/2021 1500   CL 103 01/29/2021 1500   CO2 30 01/29/2021 1500   GLUCOSE 116 (H) 01/29/2021 1500   BUN 26 (H) 01/29/2021 1500   CREATININE 1.02 (H) 01/29/2021 1500   CREATININE 0.90 01/16/2021 0819   CALCIUM 9.4 01/29/2021 1500   PROT 6.8 01/16/2021 0819   ALBUMIN 3.2 (L) 01/16/2021 0819   AST 20 01/16/2021  0819   ALT 18 01/16/2021 0819   ALKPHOS 83 01/16/2021 0819   BILITOT 0.3 01/16/2021 0819   GFRNONAA 55 (L) 01/29/2021 1500   GFRNONAA >60 01/16/2021 0819    No results found for: "TOTALPROTELP", "ALBUMINELP", "A1GS", "A2GS", "BETS", "BETA2SER", "GAMS", "MSPIKE", "SPEI"  Lab Results  Component Value Date   WBC 4.5 01/29/2021   NEUTROABS 1.9 01/16/2021   HGB 12.9 01/29/2021   HCT 39.7 01/29/2021   MCV 89.6 01/29/2021   PLT 159 01/29/2021    No results found for: "LABCA2"  No components found for: "DVVOHY073"  No results for input(s): "INR" in the last 168 hours.  No results found for: "LABCA2"  No results found for: "XTG626"  No results found for: "CAN125"  No results found for: "CAN153"  No results found for: "CA2729"  No components found for: "HGQUANT"  No results found for: "CEA1", "CEA" / No results found for: "CEA1", "CEA"   No results found for: "AFPTUMOR"  No results found for: "CHROMOGRNA"  No results found for: "KPAFRELGTCHN", "LAMBDASER", "KAPLAMBRATIO" (kappa/lambda light chains)  No results found for: "HGBA", "HGBA2QUANT", "HGBFQUANT", "HGBSQUAN" (Hemoglobinopathy evaluation)   No results found for: "LDH"  No results found for: "IRON", "TIBC", "IRONPCTSAT" (Iron and TIBC)  No results found for: "FERRITIN"  Urinalysis No results found for: "COLORURINE", "APPEARANCEUR", "LABSPEC", "PHURINE", "GLUCOSEU", "HGBUR", "BILIRUBINUR", "KETONESUR", "PROTEINUR", "UROBILINOGEN", "NITRITE", "LEUKOCYTESUR"   STUDIES: MM DIAG BREAST TOMO UNI LEFT  Result Date: 07/22/2022 CLINICAL DATA:  88 year old female presenting for evaluation of a palpable lump in the left breast on clinical breast exam. She has personal history of right breast cancer in 2022 status post lumpectomy. EXAM: DIGITAL DIAGNOSTIC UNILATERAL LEFT MAMMOGRAM WITH TOMOSYNTHESIS; ULTRASOUND LEFT BREAST LIMITED TECHNIQUE: Left digital diagnostic mammography and breast tomosynthesis was performed.;  Targeted ultrasound examination of the left breast was performed. COMPARISON:  Previous exam(s). ACR Breast Density Category b: There are scattered areas of fibroglandular density. FINDINGS: No suspicious calcifications, masses or areas of distortion are seen in the left breast. Ultrasound targeted to the axillary tail upper outer left breast demonstrates normal fibroglandular tissue. No suspicious masses or areas of shadowing are identified. IMPRESSION: 1. There are no suspicious mammographic or targeted sonographic abnormalities at the palpable site of concern in the upper-outer left breast. No evidence of left breast malignancy. RECOMMENDATION: 1. Clinical follow-up recommended for the palpable area of concern in the left breast. Any  further workup should be based on clinical grounds. 2. Bilateral diagnostic mammogram for routine annual post lumpectomy surveillance is recommended in June of 2024. I have discussed the findings and recommendations with the patient. If applicable, a reminder letter will be sent to the patient regarding the next appointment. BI-RADS CATEGORY  1: Negative. Electronically Signed   By: Ammie Ferrier M.D.   On: 07/22/2022 14:36  US BREAST LTD UNI LEFT INC AXILLA  Result Date: 07/22/2022 CLINICAL DATA:  88 year old female presenting for evaluation of a palpable lump in the left breast on clinical breast exam. She has personal history of right breast cancer in 2022 status post lumpectomy. EXAM: DIGITAL DIAGNOSTIC UNILATERAL LEFT MAMMOGRAM WITH TOMOSYNTHESIS; ULTRASOUND LEFT BREAST LIMITED TECHNIQUE: Left digital diagnostic mammography and breast tomosynthesis was performed.; Targeted ultrasound examination of the left breast was performed. COMPARISON:  Previous exam(s). ACR Breast Density Category b: There are scattered areas of fibroglandular density. FINDINGS: No suspicious calcifications, masses or areas of distortion are seen in the left breast. Ultrasound targeted to the  axillary tail upper outer left breast demonstrates normal fibroglandular tissue. No suspicious masses or areas of shadowing are identified. IMPRESSION: 1. There are no suspicious mammographic or targeted sonographic abnormalities at the palpable site of concern in the upper-outer left breast. No evidence of left breast malignancy. RECOMMENDATION: 1. Clinical follow-up recommended for the palpable area of concern in the left breast. Any further workup should be based on clinical grounds. 2. Bilateral diagnostic mammogram for routine annual post lumpectomy surveillance is recommended in June of 2024. I have discussed the findings and recommendations with the patient. If applicable, a reminder letter will be sent to the patient regarding the next appointment. BI-RADS CATEGORY  1: Negative. Electronically Signed   By: Ammie Ferrier M.D.   On: 07/22/2022 14:36    ELIGIBLE FOR AVAILABLE RESEARCH PROTOCOL: no  ASSESSMENT: 88 y.o. Kelly Friedman woman status post right breast biopsy 01/04/2021 for ductal carcinoma in situ, estrogen and progesterone receptor positive  (1) genetics testing (A) Negative hereditary cancer genetic testing: no pathogenic variants detected in [facility] Panel.  Variants of uncertain significance detected in AXIN2 at c.1235A>G (p.Asn412Ser) and RNF43 at c.2054C>A (p.Thr685Asn).  The report date is February 10, 2019.    The [facility] gene panel offered by [employer] includes sequencing and deletion/duplication testing of the following 35 genes: APC, ATM, AXIN2, BARD1, BMPR1A, BRCA1, BRCA2, BRIP1, CHD1, CDK4, CDKN2A, CHEK2, EPCAM (large rearrangement only), HOXB13, (sequencing only), GALNT12, MLH1, MSH2, MSH3 (excluding repetitive portions of exon 1), MSH6, MUTYH, NBN, NTHL1, PALB2, PMS2, PTEN, RAD51C, RAD51D, RNF43, RPS20, SMAD4, STK11, and TP53. Sequencing was performed for select regions of POLE and POLD1, and large rearrangement analysis was performed for select regions of  GREM1.  (2) status post right lumpectomy 02/05/2021 for ductal carcinoma in situ measuring 1.6 cm, grade 2, with negative margins.  (3) anastrozole started 01/17/2021   PLAN:  Kelly Friedman is here for follow-up on anastrozole.   Since last visit she had a mammogram on December 12, 2021 which showed no evidence of malignancy with in either breast. She had bone density done on 4/17 which showed a T score of -1.3, considered osteopenia. I continue the feel the abnormal density in the left breast upper outer quadrant however this was not concerning on mammogram or the [place] She will get mammogram done in June 2024. SBE recommended. She will report any changes to [place]. RTC with me in June or July for follow up.  Total time: 30 min *Total Encounter Time as defined by the Centers for Medicare and Medicaid Services includes, in addition to the face-to-face time of a patient visit (documented in the note above) non-face-to-face time: obtaining and reviewing outside history, ordering and reviewing medications, tests or procedures, care coordination (communications with other health care professionals or caregivers) and documentation in the medical record.

## 2022-12-16 ENCOUNTER — Ambulatory Visit
Admission: RE | Admit: 2022-12-16 | Discharge: 2022-12-16 | Disposition: A | Discharge status: Home / Self Care | Payer: Medicare Other | Source: Ambulatory Visit | Attending: Hematology and Oncology | Admitting: Hematology and Oncology

## 2022-12-16 DIAGNOSIS — Z9889 Other specified postprocedural states: Secondary | ICD-10-CM

## 2023-01-12 ENCOUNTER — Other Ambulatory Visit: Payer: Self-pay

## 2023-01-12 ENCOUNTER — Inpatient Hospital Stay: Payer: Medicare Other | Admitting: Hematology and Oncology

## 2023-01-12 VITALS — BP 138/70 | HR 63 | Temp 97.3°F | Resp 16 | Wt 164.9 lb

## 2023-01-12 DIAGNOSIS — Z79811 Long term (current) use of aromatase inhibitors: Secondary | ICD-10-CM | POA: Insufficient documentation

## 2023-01-12 DIAGNOSIS — M129 Arthropathy, unspecified: Secondary | ICD-10-CM | POA: Diagnosis not present

## 2023-01-12 DIAGNOSIS — Z803 Family history of malignant neoplasm of breast: Secondary | ICD-10-CM | POA: Insufficient documentation

## 2023-01-12 DIAGNOSIS — Z170 Estrogen receptor positive status [ER+]: Secondary | ICD-10-CM | POA: Diagnosis not present

## 2023-01-12 DIAGNOSIS — Z800 Family history of malignant neoplasm of digestive organs: Secondary | ICD-10-CM | POA: Insufficient documentation

## 2023-01-12 DIAGNOSIS — Z1231 Encounter for screening mammogram for malignant neoplasm of breast: Secondary | ICD-10-CM

## 2023-01-12 DIAGNOSIS — D649 Anemia, unspecified: Secondary | ICD-10-CM | POA: Diagnosis not present

## 2023-01-12 DIAGNOSIS — E039 Hypothyroidism, unspecified: Secondary | ICD-10-CM | POA: Diagnosis not present

## 2023-01-12 DIAGNOSIS — Z801 Family history of malignant neoplasm of trachea, bronchus and lung: Secondary | ICD-10-CM | POA: Diagnosis not present

## 2023-01-12 DIAGNOSIS — D0511 Intraductal carcinoma in situ of right breast: Secondary | ICD-10-CM | POA: Diagnosis present

## 2023-01-12 MED ORDER — ANASTROZOLE 1 MG PO TABS: 1.0000 mg | ORAL_TABLET | Freq: Every day | ORAL | 4 refills | Status: DC

## 2023-01-12 NOTE — Progress Notes (Signed)
[facility]  Telephone:([phone]) [phone] Fax:(336) [phone]     ID: Kelly Friedman DOB: 02-06-1935  MR#: [id number]  NGE#:[id number]  Patient Care Team: Noberto Retort, MD as PCP - General (Family Medicine) Donnelly Angelica, RN as Oncology Nurse Navigator Pershing Proud, RN as Oncology Nurse Navigator Magrinat, Valentino Hue, MD (Inactive) as Consulting Physician (Oncology) Almond Lint, MD as Consulting Physician (General Surgery) Dorothy Puffer, MD as Consulting Physician (Radiation Oncology) Zelphia Cairo, MD as Consulting Physician (Obstetrics and Gynecology) Dial, Durenda Hurt, DPM as Referring Physician (Podiatry) Shon Millet, MD as Consulting Physician (Ophthalmology) Ashok Croon, MD as Referring Physician (Orthopedic Surgery) Bufford Buttner, MD as Referring Physician (Dermatology) Rachel Moulds, MD OTHER MD:  CHIEF COMPLAINT: Estrogen receptor positive noninvasive breast cancer  CURRENT TREATMENT: anastrozole  INTERVAL HISTORY:  Kelly Friedman returns today for follow up of her noninvasive breast cancer. Patient is tolerating anastrozole very well, Since last visit, she had a mammogram negative for malignancy.  She walks around [phone] steps a day and does water aerobics about 3/4 times a week. She denies any changes in her health so far. No interim hospitalizations. No change in medications. Rest of the pertinent 10 point ROS reviewed and negative   COVID 19 VACCINATION STATUS: Pfizer x3   HISTORY OF CURRENT ILLNESS: From the original intake note:  Kelly Friedman had routine screening mammography on 12/11/2020 showing a possible abnormality in the right breast. She underwent right diagnostic mammography with tomography and right breast ultrasonography at [facility] on 01/03/2021 showing: breast density category C; persistent focal distortion within superior right breast without sonographic correlate.  Accordingly on 01/04/2021 she proceeded to  biopsy of the right breast area in question. The pathology from this procedure ([id number]) showed: ductal carcinoma in situ with calcifications involving a complex sclerosing lesion. Prognostic indicators significant for: estrogen receptor, 95% positive and progesterone receptor, 1% positive, both with strong staining intensity.    Cancer Staging  Ductal carcinoma in situ (DCIS) of right breast Staging form: Breast, AJCC 8th Edition - Clinical stage from 01/16/2021: Stage 0 (cTis (DCIS), cN0, cM0, ER+, PR+) - Signed by Lowella Dell, MD on 01/16/2021 Stage prefix: Initial diagnosis Kelly Friedman: G3   The patient's subsequent history is as detailed below.   PAST MEDICAL HISTORY: Past Medical History:  Diagnosis Date   Anemia    Arthritis    Breast cancer (HCC)    Hypothyroidism    Pituitary adenoma (HCC) 10/05/2012   Pre-diabetes    Thyroid disease     PAST SURGICAL HISTORY: Past Surgical History:  Procedure Laterality Date   APPENDECTOMY     BREAST BIOPSY Right 01/04/2021   BREAST LUMPECTOMY Right 02/05/2021   BREAST LUMPECTOMY WITH RADIOACTIVE SEED LOCALIZATION Right 02/05/2021   Procedure: RIGHT BREAST LUMPECTOMY WITH RADIOACTIVE SEED LOCALIZATION;  Surgeon: Almond Lint, MD;  Location: [facility] OR;  Service: General;  Laterality: Right;   CATARACT EXTRACTION     DILATION AND CURETTAGE OF UTERUS     EYE SURGERY     HAMMER TOE SURGERY Right    mole removed  2013 and 2006   TUBAL LIGATION      FAMILY HISTORY: Family History  Problem Relation Age of Onset   Breast cancer Sister    Pancreatic cancer Sister    Breast cancer Sister    Lung cancer Sister    Prostate cancer Brother    Stomach cancer Paternal Grandmother    Her father died at age  65 from cirrhosis (alcohol-induced). Her mother died at age 49. Kelly Friedman has two brothers and four sisters. She reports breast cancer in two sisters, one at age 72, who was also diagnosed with pancreatic cancer, and the other at age  75. She also reports lung cancer in a sister at age 38, prostate cancer in a brother at age 18, and stomach cancer in her paternal grandmother.   GYNECOLOGIC HISTORY:  No LMP recorded. Patient is postmenopausal. Menarche: 88 years old Age at first live birth: 88 years old GX P 2 LMP 1995 Contraceptive never used HRT never used  Hysterectomy? no BSO? 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 Husband Kelly Friedman was a retired Merchandiser, retail.  He died from pneumonia at home in 2022. Daughter Kelly Friedman, age 75, is a group Herbalist here in [place]. Son Kelly Friedman, age 12, is a Hydrographic surveyor in [place], [place]. [name] has three grandchildren, aged 70, 33, and 53. She has no great-grandchildren. She attends a Psychologist, counselling church.    ADVANCED DIRECTIVES: To be determined   HEALTH MAINTENANCE: Social History   Tobacco Use   Smoking status: Never   Smokeless tobacco: Never  Vaping Use   Vaping Use: Never used  Substance Use Topics   Alcohol use: Yes    Comment: 1 month   Drug use: No     Colonoscopy: 2012 (with [facility])  PAP: 2017  Bone density: 12/2019   No Known Allergies  Current Outpatient Medications  Medication Sig Dispense Refill   anastrozole (ARIMIDEX) 1 MG tablet Take 1 tablet (1 mg total) by mouth daily. 90 tablet 4   Ascorbic Acid (VITAMIN C) 1000 MG tablet Take 1,000 mg by mouth in the morning.     Biotin 09811 MCG TBDP Take 10,000 mcg by mouth in the morning.     brimonidine (ALPHAGAN) 0.2 % ophthalmic solution Place 1 drop into both eyes in the morning and at bedtime.     Cholecalciferol (VITAMIN D3) 50 MCG (2000 UT) TABS Take 2,000 Units by mouth in the morning. 90 tablet 4   ELDERBERRY PO Take 2 tablets by mouth in the morning. Elderberry Gummies     fluticasone (FLONASE) 50 MCG/ACT nasal spray Place 1 spray into both nostrils daily as needed for allergies or rhinitis.      levothyroxine (SYNTHROID) 25 MCG tablet Take 25 mcg by mouth every morning.     meclizine (ANTIVERT) 25 MG tablet Take 25 mg by mouth 3 (three) times daily as needed (vertigo).     Multiple Vitamin (MULTIVITAMIN WITH MINERALS) TABS tablet Take 1 tablet by mouth daily. Women's Multivitamin     psyllium (METAMUCIL SMOOTH TEXTURE) 28 % packet Take 1 packet by mouth daily as needed (digestive regularity).     Travoprost, BAK Free, (TRAVATAN) 0.004 % SOLN ophthalmic solution Place 1 drop into both eyes at bedtime.     Zinc 50 MG TABS Take 50 mg by mouth in the morning.     No current facility-administered medications for this visit.    OBJECTIVE: African-American woman who appears younger than stated age  There were no vitals filed for this visit.    There is no height or weight on file to calculate BMI.   Wt Readings from Last 3 Encounters:  08/12/22 165 lb 6.4 oz (75 kg)  07/01/22 165 lb 14.4 oz (75.3 kg)  12/31/21 165 lb 12.8 oz (75.2 kg)  ECOG FS:1 - Symptomatic but completely ambulatory  Physical Exam Constitutional:      Appearance: Normal appearance.  Chest:     Comments: Bilateral breasts inspected.  No palpable masses or regional adenopathy. Scattered density. Musculoskeletal:     Cervical back: Normal range of motion and neck supple. No rigidity.  Lymphadenopathy:     Cervical: No cervical adenopathy.  Neurological:     Mental Status: She is alert.       LAB RESULTS:  CMP     Component Value Date/Time   NA 141 01/29/2021 1500   K 3.6 01/29/2021 1500   CL 103 01/29/2021 1500   CO2 30 01/29/2021 1500   GLUCOSE 116 (H) 01/29/2021 1500   BUN 26 (H) 01/29/2021 1500   CREATININE 1.02 (H) 01/29/2021 1500   CREATININE 0.90 01/16/2021 0819   CALCIUM 9.4 01/29/2021 1500   PROT 6.8 01/16/2021 0819   ALBUMIN 3.2 (L) 01/16/2021 0819   AST 20 01/16/2021 0819   ALT 18 01/16/2021 0819   ALKPHOS 83 01/16/2021 0819   BILITOT 0.3 01/16/2021 0819   GFRNONAA 55 (L)  01/29/2021 1500   GFRNONAA >60 01/16/2021 0819    No results found for: "TOTALPROTELP", "ALBUMINELP", "A1GS", "A2GS", "BETS", "BETA2SER", "GAMS", "MSPIKE", "SPEI"  Lab Results  Component Value Date   WBC 4.5 01/29/2021   NEUTROABS 1.9 01/16/2021   HGB 12.9 01/29/2021   HCT 39.7 01/29/2021   MCV 89.6 01/29/2021   PLT 159 01/29/2021    No results found for: "LABCA2"  No components found for: "ZOXWRU045"  No results for input(s): "INR" in the last 168 hours.  No results found for: "LABCA2"  No results found for: "WUJ811"  No results found for: "CAN125"  No results found for: "CAN153"  No results found for: "CA2729"  No components found for: "HGQUANT"  No results found for: "CEA1", "CEA" / No results found for: "CEA1", "CEA"   No results found for: "AFPTUMOR"  No results found for: "CHROMOGRNA"  No results found for: "KPAFRELGTCHN", "LAMBDASER", "KAPLAMBRATIO" (kappa/lambda light chains)  No results found for: "HGBA", "HGBA2QUANT", "HGBFQUANT", "HGBSQUAN" (Hemoglobinopathy evaluation)   No results found for: "LDH"  No results found for: "IRON", "TIBC", "IRONPCTSAT" (Iron and TIBC)  No results found for: "FERRITIN"  Urinalysis No results found for: "COLORURINE", "APPEARANCEUR", "LABSPEC", "PHURINE", "GLUCOSEU", "HGBUR", "BILIRUBINUR", "KETONESUR", "PROTEINUR", "UROBILINOGEN", "NITRITE", "LEUKOCYTESUR"   STUDIES: MM DIAG BREAST TOMO BILATERAL  Result Date: 12/16/2022 CLINICAL DATA:  88 year old female presents for annual evaluation. History of RIGHT breast cancer and lumpectomy in 2022. EXAM: DIGITAL DIAGNOSTIC BILATERAL MAMMOGRAM WITH TOMOSYNTHESIS TECHNIQUE: Bilateral digital diagnostic mammography and breast tomosynthesis was performed. COMPARISON:  Previous exam(s). ACR Breast Density Category b: There are scattered areas of fibroglandular density. FINDINGS: 2D and 3D full field views of both breasts and a magnification view of the lumpectomy site demonstrate  no suspicious mass, nonsurgical distortion or worrisome calcifications. RIGHT lumpectomy and fat necrosis changes noted. IMPRESSION: No evidence of breast malignancy. RECOMMENDATION: Per protocol, as the patient is now 2 or more years status post lumpectomy, she may return to annual screening mammography in 1 year. However, given the history of breast cancer, the patient remains eligible for annual diagnostic mammography if preferred. (Code:[id number]-B-01Y) I have discussed the findings and recommendations with the patient. If applicable, a reminder letter will be sent to the patient regarding the next appointment. BI-RADS CATEGORY  2: Benign. Electronically Signed   By: Harmon Pier M.D.   On: 12/16/2022 15:04  ELIGIBLE FOR AVAILABLE RESEARCH PROTOCOL: no  ASSESSMENT: 88 y.o. Chad Cordial woman status post right breast biopsy 01/04/2021 for ductal carcinoma in situ, estrogen and progesterone receptor positive  (1) genetics testing (A) Negative hereditary cancer genetic testing: no pathogenic variants detected in [facility] Panel.  Variants of uncertain significance detected in AXIN2 at c.1235A>G (p.Asn412Ser) and RNF43 at c.2054C>A (p.Thr685Asn).  The report date is February 10, 2019.    The [facility] gene panel offered by [employer] includes sequencing and deletion/duplication testing of the following 35 genes: APC, ATM, AXIN2, BARD1, BMPR1A, BRCA1, BRCA2, BRIP1, CHD1, CDK4, CDKN2A, CHEK2, EPCAM (large rearrangement only), HOXB13, (sequencing only), GALNT12, MLH1, MSH2, MSH3 (excluding repetitive portions of exon 1), MSH6, MUTYH, NBN, NTHL1, PALB2, PMS2, PTEN, RAD51C, RAD51D, RNF43, RPS20, SMAD4, STK11, and TP53. Sequencing was performed for select regions of POLE and POLD1, and large rearrangement analysis was performed for select regions of GREM1.  (2) status post right lumpectomy 02/05/2021 for ductal carcinoma in situ measuring 1.6 cm, Friedman 2, with negative margins.  (3) anastrozole  started 01/17/2021   PLAN:  [name] is here for follow-up on anastrozole.  She will complete this in July 2027. Since her last visit here, she denies any complaints at all. No concerns on exam today. Most recent mammogram showed changes consistent with fat necrosis. She had bone density done on 4/17 which showed a T score of -1.3, considered osteopenia. She walks daily and does water aerobics 3 to 4 times a week. She will get mammogram done in June 2024. SBE recommended. She will report any changes to [place]. RTC with me in June or July for follow up.  Total time: 30 min *Total Encounter Time as defined by the Centers for Medicare and Medicaid Services includes, in addition to the face-to-face time of a patient visit (documented in the note above) non-face-to-face time: obtaining and reviewing outside history, ordering and reviewing medications, tests or procedures, care coordination (communications with other health care professionals or caregivers) and documentation in the medical record.

## 2023-01-13 ENCOUNTER — Ambulatory Visit: Payer: Medicare Other | Admitting: Hematology and Oncology

## 2023-01-21 ENCOUNTER — Encounter: Payer: Self-pay | Admitting: Dermatology

## 2023-01-21 ENCOUNTER — Ambulatory Visit (INDEPENDENT_AMBULATORY_CARE_PROVIDER_SITE_OTHER): Payer: Medicare Other | Admitting: Dermatology

## 2023-01-21 VITALS — BP 124/84 | HR 67

## 2023-01-21 DIAGNOSIS — N907 Vulvar cyst: Secondary | ICD-10-CM

## 2023-01-21 DIAGNOSIS — L729 Follicular cyst of the skin and subcutaneous tissue, unspecified: Secondary | ICD-10-CM

## 2023-01-21 DIAGNOSIS — L821 Other seborrheic keratosis: Secondary | ICD-10-CM

## 2023-01-21 NOTE — Patient Instructions (Addendum)
Thank you for visiting my office today. I appreciate your proactive approach to your health and your commitment to addressing any concerns you have about your skin.  Here is a summary of the key points from our discussion:  - Cyst on Right Labia Majora:   - Diagnosis: Subcutaneous nodule, likely a cyst.   - Recommendation: Avoid squeezing to prevent infection.   - Monitoring: If the cyst becomes painful, tender, grows, or shows signs of infection, please contact our office immediately for potential antibiotic treatment and drainage.  - Skin Observations on Shin:   - Diagnosis: Flat top papules identified as seborrheic keratosis, commonly known as wisdom spots.   - Recommendation: These are benign and do not require treatment unless they change in appearance or become bothersome.  Please feel free to reach out if you have any further questions or if there are any changes in your symptoms. It was a pleasure meeting you, and I look forward to assisting you in the future with any dermatological needs.   Due to recent changes in healthcare laws, you may see results of your pathology and/or laboratory studies on MyChart before the doctors have had a chance to review them. We understand that in some cases there may be results that are confusing or concerning to you. Please understand that not all results are received at the same time and often the doctors may need to interpret multiple results in order to provide you with the best plan of care or course of treatment. Therefore, we ask that you please give [place] 2 business days to thoroughly review all your results before contacting the office for clarification. Should we see a critical lab result, you will be contacted sooner.   If You Need Anything After Your Visit  If you have any questions or concerns for your doctor, please call our main line at [phone] If no one answers, please leave a voicemail as directed and we will return your call as soon  as possible. Messages left after 4 pm will be answered the following business day.   You may also send [place] a message via MyChart. We typically respond to MyChart messages within 1-2 business days.  For prescription refills, please ask your pharmacy to contact our office. Our fax number is [phone].  If you have an urgent issue when the clinic is closed that cannot wait until the next business day, you can page your doctor at the number below.    Please note that while we do our best to be available for urgent issues outside of office hours, we are not available 24/7.   If you have an urgent issue and are unable to reach [place], you may choose to seek medical care at your doctor's office, retail clinic, urgent care center, or emergency room.  If you have a medical emergency, please immediately call 911 or go to the emergency department. In the event of inclement weather, please call our main line at [phone] for an update on the status of any delays or closures.  Dermatology Medication Tips: Please keep the boxes that topical medications come in in order to help keep track of the instructions about where and how to use these. Pharmacies typically print the medication instructions only on the boxes and not directly on the medication tubes.   If your medication is too expensive, please contact our office at [phone] or send [place] a message through MyChart.   We are unable to tell what your co-pay for  medications will be in advance as this is different depending on your insurance coverage. However, we may be able to find a substitute medication at lower cost or fill out paperwork to get insurance to cover a needed medication.   If a prior authorization is required to get your medication covered by your insurance company, please allow [place] 1-2 business days to complete this process.  Drug prices often vary depending on where the prescription is filled and some pharmacies may offer cheaper  prices.  The website www.goodrx.com contains coupons for medications through different pharmacies. The prices here do not account for what the cost may be with help from insurance (it may be cheaper with your insurance), but the website can give you the price if you did not use any insurance.  - You can print the associated coupon and take it with your prescription to the pharmacy.  - You may also stop by our office during regular business hours and pick up a GoodRx coupon card.  - If you need your prescription sent electronically to a different pharmacy, notify our office through [facility] or by phone at [phone]

## 2023-01-21 NOTE — Progress Notes (Unsigned)
   New Patient Visit   Subjective  Kelly Friedman is a 88 y.o. female who presents for the following: Growth on labia majora  Patient states she has a growth located at the labia Majora that she would like to have examined. Patient reports the areas have been there for  2  year(s). She reports the areas are not bothersome. She states that the areas has not spread. Patient reports has not previously been treated for these areas. Patient reports Hx of bx. Patient denies family history of skin cancer(s).   The following portions of the chart were reviewed this encounter and updated as appropriate: medications, allergies, medical history  Review of Systems:  No other skin or systemic complaints except as noted in HPI or Assessment and Plan.  Objective  Well appearing patient in no apparent distress; mood and affect are within normal limits.  A focused examination was performed of the following areas: Right Labia Majora: 2 cm subcutaneous nodule on the right labia majora, consistent with a cyst. Non-mobile, can express pus when squeezed, indicating the presence of sebum and skin cells.    Flat-topped papules on the lower ext,  Relevant exam findings are noted in the Assessment and Plan.  Assessment & Plan   Cyst Exam: 2cm subcutaneous Nodule  Treatment Plan: - Educated that the cyst is benign - Advised not to squeeze to prevent risk of infections at the site - Advised if anything changes, develops signs of infection to contact   SEBORRHEIC KERATOSES- Benign normal skin lesions Exam: Flat tough papule(s)  Treatment Plan: - Benign-appearing - Call for any changes   Return if symptoms worsen or fail to improve.  Documentation: I have reviewed the above documentation for accuracy and completeness, and I agree with the above.  Stasia Cavalier, am acting as scribe for Langston Reusing, DO.   Langston Reusing, DO

## 2023-01-23 ENCOUNTER — Other Ambulatory Visit: Payer: Self-pay | Admitting: Hematology and Oncology

## 2023-01-27 ENCOUNTER — Telehealth: Payer: Self-pay | Admitting: Hematology and Oncology

## 2023-01-27 NOTE — Telephone Encounter (Signed)
 Left patient a vm regarding upcoming appointment

## 2023-05-09 ENCOUNTER — Encounter (HOSPITAL_BASED_OUTPATIENT_CLINIC_OR_DEPARTMENT_OTHER): Payer: Self-pay | Admitting: Emergency Medicine

## 2023-05-09 ENCOUNTER — Emergency Department (HOSPITAL_BASED_OUTPATIENT_CLINIC_OR_DEPARTMENT_OTHER): Payer: Medicare Other | Admitting: Radiology

## 2023-05-09 ENCOUNTER — Other Ambulatory Visit: Payer: Self-pay

## 2023-05-09 ENCOUNTER — Emergency Department (HOSPITAL_BASED_OUTPATIENT_CLINIC_OR_DEPARTMENT_OTHER): Payer: Medicare Other

## 2023-05-09 ENCOUNTER — Emergency Department (HOSPITAL_BASED_OUTPATIENT_CLINIC_OR_DEPARTMENT_OTHER)
Admission: EM | Admit: 2023-05-09 | Discharge: 2023-05-09 | Disposition: A | Discharge status: Home / Self Care | Payer: Medicare Other | Attending: Emergency Medicine | Admitting: Emergency Medicine

## 2023-05-09 DIAGNOSIS — H538 Other visual disturbances: Secondary | ICD-10-CM | POA: Diagnosis not present

## 2023-05-09 DIAGNOSIS — Z853 Personal history of malignant neoplasm of breast: Secondary | ICD-10-CM | POA: Insufficient documentation

## 2023-05-09 DIAGNOSIS — E039 Hypothyroidism, unspecified: Secondary | ICD-10-CM | POA: Diagnosis not present

## 2023-05-09 DIAGNOSIS — R55 Syncope and collapse: Secondary | ICD-10-CM | POA: Diagnosis present

## 2023-05-09 DIAGNOSIS — Z91010 Allergy to peanuts: Secondary | ICD-10-CM | POA: Insufficient documentation

## 2023-05-09 DIAGNOSIS — R42 Dizziness and giddiness: Secondary | ICD-10-CM | POA: Diagnosis not present

## 2023-05-09 DIAGNOSIS — Z79899 Other long term (current) drug therapy: Secondary | ICD-10-CM | POA: Insufficient documentation

## 2023-05-09 LAB — CBC WITH DIFFERENTIAL/PLATELET
Abs Immature Granulocytes: 0.02 10*3/uL (ref 0.00–0.07)
Basophils Absolute: 0 10*3/uL (ref 0.0–0.1)
Basophils Relative: 1 %
Eosinophils Absolute: 0 10*3/uL (ref 0.0–0.5)
Eosinophils Relative: 0 %
HCT: 39.9 % (ref 36.0–46.0)
Hemoglobin: 12.9 g/dL (ref 12.0–15.0)
Immature Granulocytes: 0 %
Lymphocytes Relative: 28 %
Lymphs Abs: 1.7 10*3/uL (ref 0.7–4.0)
MCH: 28.7 pg (ref 26.0–34.0)
MCHC: 32.3 g/dL (ref 30.0–36.0)
MCV: 88.9 fL (ref 80.0–100.0)
Monocytes Absolute: 0.3 10*3/uL (ref 0.1–1.0)
Monocytes Relative: 5 %
Neutro Abs: 4.1 10*3/uL (ref 1.7–7.7)
Neutrophils Relative %: 66 %
Platelets: 151 10*3/uL (ref 150–400)
RBC: 4.49 MIL/uL (ref 3.87–5.11)
RDW: 13.7 % (ref 11.5–15.5)
WBC: 6.1 10*3/uL (ref 4.0–10.5)
nRBC: 0 % (ref 0.0–0.2)

## 2023-05-09 LAB — COMPREHENSIVE METABOLIC PANEL
ALT: 22 U/L (ref 0–44)
AST: 27 U/L (ref 15–41)
Albumin: 4.2 g/dL (ref 3.5–5.0)
Alkaline Phosphatase: 75 U/L (ref 38–126)
Anion gap: 5 (ref 5–15)
BUN: 27 mg/dL — ABNORMAL HIGH (ref 8–23)
CO2: 29 mmol/L (ref 22–32)
Calcium: 9.8 mg/dL (ref 8.9–10.3)
Chloride: 103 mmol/L (ref 98–111)
Creatinine, Ser: 0.92 mg/dL (ref 0.44–1.00)
GFR, Estimated: >60 mL/min (ref >60)
Glucose, Bld: 146 mg/dL — ABNORMAL HIGH (ref 70–99)
Potassium: 4.5 mmol/L (ref 3.5–5.1)
Sodium: 137 mmol/L (ref 135–145)
Total Bilirubin: 0.5 mg/dL (ref 0.3–1.2)
Total Protein: 7.3 g/dL (ref 6.5–8.1)

## 2023-05-09 LAB — URINALYSIS, ROUTINE W REFLEX MICROSCOPIC
Bacteria, UA: NONE SEEN
Bilirubin Urine: NEGATIVE
Glucose, UA: NEGATIVE mg/dL
Hgb urine dipstick: NEGATIVE
Ketones, ur: NEGATIVE mg/dL
Leukocytes,Ua: NEGATIVE
Nitrite: NEGATIVE
Protein, ur: NEGATIVE mg/dL
Specific Gravity, Urine: <1.005 — ABNORMAL LOW (ref 1.005–1.030)
pH: 6.5 (ref 5.0–8.0)

## 2023-05-09 LAB — TROPONIN I (HIGH SENSITIVITY)
Troponin I (High Sensitivity): 7 ng/L (ref <18)
Troponin I (High Sensitivity): 10 ng/L (ref <18)

## 2023-05-09 LAB — MAGNESIUM: Magnesium: 2.4 mg/dL (ref 1.7–2.4)

## 2023-05-09 LAB — CK: Total CK: 157 U/L (ref 38–234)

## 2023-05-09 MED ORDER — MECLIZINE HCL 25 MG PO TABS
25.0000 mg | ORAL_TABLET | Freq: Once | ORAL | Status: AC
  Administered 2023-05-09: 25 mg via ORAL
  Filled 2023-05-09: qty 1

## 2023-05-09 NOTE — ED Provider Notes (Signed)
[place] EMERGENCY DEPARTMENT AT [facility] Provider Note   CSN: [id number] Arrival date & time: 05/09/23  1601     History Chief Complaint  Patient presents with   Loss of Consciousness    Kelly Friedman is a 88 y.o. female with h/o vertigo, hypothyroidism, glaucoma, and breast cancer in remission, presents to the ER for evaluation of syncopal episode today. The patient reports that around 1000 today, she was sitting in her chair reading a book when she started to feel lightheaded, no room spinning sensation. She reports that her vision became blurred and it felt like she was going to pass out. No headache, chest pain, SOB, or palpitations during this time. She reports that she stood up form her chair to walk a short distance to her bed so that she could lay down as she felt like she was going to pass out. She reports she had a syncopal episode at that time. She reports that she woke up on her back and thinks she may have been laying there for 15 minutes. She reports that she felt too lightheaded to sit up, but was able to crawl to the phone and called her daughter. Her daughter reports that she had trouble sitting up due to her lightheadedness/dizziness symptoms. She was eventually able to stand up and walked to the bathroom by herself, but kept a hand on the wall. She reports that she was able to walk through the living room and just held on to furniture incase she felt like she was going to pass out again. She still denies any blurry vision, headache, chest pain, SOB, or palpitations at this time. Denies any unilateral weakness. No blood thinner use. She was seen at [facility] and sent over for further evaluation. She did take a meclizine prior to arrival and reports improvement of her symptoms. She reports it does feel similar to her vertigo, except she has never passed out from it. The patient received her flu and RSV vaccine on Monday.    Loss of Consciousness Associated symptoms:  no chest pain, no fever, no headaches, no nausea, no palpitations, no shortness of breath and no vomiting        Home Medications Prior to Admission medications   Medication Sig Start Date End Date Taking? Authorizing Provider  anastrozole (ARIMIDEX) 1 MG tablet TAKE 1 TABLET DAILY 01/23/23   Rachel Moulds, MD  Ascorbic Acid (VITAMIN C) 1000 MG tablet Take 1,000 mg by mouth in the morning.    [provider]  Biotin 40981 MCG TBDP Take 10,000 mcg by mouth in the morning.    [provider]  brimonidine (ALPHAGAN) 0.2 % ophthalmic solution Place 1 drop into both eyes in the morning and at bedtime. 11/01/20   [provider]  Cholecalciferol (VITAMIN D3) 50 MCG (2000 UT) TABS Take 2,000 Units by mouth in the morning. 05/15/21   Magrinat, Valentino Hue, MD  ELDERBERRY PO Take 2 tablets by mouth in the morning. Elderberry Gummies    [provider]  fluticasone (FLONASE) 50 MCG/ACT nasal spray Place 1 spray into both nostrils daily as needed for allergies or rhinitis.    [provider]  levothyroxine (SYNTHROID) 25 MCG tablet Take 25 mcg by mouth every morning. 11/02/20   [provider]  meclizine (ANTIVERT) 25 MG tablet Take 25 mg by mouth 3 (three) times daily as needed (vertigo).    [provider]  Multiple Vitamin (MULTIVITAMIN WITH MINERALS) TABS tablet Take 1 tablet  by mouth daily. Women's Multivitamin    [provider]  psyllium (METAMUCIL SMOOTH TEXTURE) 28 % packet Take 1 packet by mouth daily as needed (digestive regularity).    [provider]  Travoprost, BAK Free, (TRAVATAN) 0.004 % SOLN ophthalmic solution Place 1 drop into both eyes at bedtime. 11/01/20   [provider]  Zinc 50 MG TABS Take 50 mg by mouth in the morning.    [provider]      Allergies    Peanut-containing drug products    Review of Systems   Review of Systems  Constitutional:  Negative for chills and fever.   Respiratory:  Negative for cough and shortness of breath.   Cardiovascular:  Positive for syncope. Negative for chest pain, palpitations and leg swelling.  Gastrointestinal:  Negative for abdominal pain, constipation, diarrhea, nausea and vomiting.  Genitourinary:  Negative for dysuria and hematuria.  Musculoskeletal:  Negative for back pain and neck pain.  Neurological:  Positive for syncope and light-headedness. Negative for speech difficulty and headaches.    Physical Exam Updated Vital Signs BP (!) 156/82   Pulse 76   Temp 97.6 F (36.4 C)   Resp 16   SpO2 99%  Physical Exam Constitutional:      General: She is not in acute distress.    Appearance: She is not toxic-appearing.  HENT:     Head: Normocephalic and atraumatic.     Comments: No Battle signs or raccoon eyes. Scalp non tender to palpation. No signs of trauma. No step off or deformity.     Mouth/Throat:     Mouth: Mucous membranes are moist.  Eyes:     General: No scleral icterus. Neck:     Comments: No midline tenderness to palpation. Full ROM. No step off or deformities. No overlying skin changes or signs of trauma.  Cardiovascular:     Rate and Rhythm: Normal rate.     Pulses: Normal pulses.  Pulmonary:     Effort: Pulmonary effort is normal. No respiratory distress.  Chest:     Chest wall: No tenderness.  Abdominal:     Palpations: Abdomen is soft.     Tenderness: There is no abdominal tenderness. There is no guarding or rebound.  Musculoskeletal:     Cervical back: Normal range of motion. No tenderness.     Right lower leg: No edema.     Left lower leg: No edema.  Skin:    General: Skin is warm and dry.  Neurological:     General: No focal deficit present.     Mental Status: She is alert. Mental status is at baseline.     GCS: GCS eye subscore is 4. GCS verbal subscore is 5. GCS motor subscore is 6.     Cranial Nerves: No cranial nerve deficit, dysarthria or facial asymmetry.     Sensory: No  sensory deficit.     Motor: No weakness.     Coordination: Finger-Nose-Finger Test normal.     Comments: Answering questions appropriately with appropriate speech     ED Results / Procedures / Treatments   Labs (all labs ordered are listed, but only abnormal results are displayed) Labs Reviewed  COMPREHENSIVE METABOLIC PANEL - Abnormal; Notable for the following components:      Result Value   Glucose, Bld 146 (*)    BUN 27 (*)    All other components within normal limits  URINALYSIS, ROUTINE W REFLEX MICROSCOPIC - Abnormal; Notable for the following  components:   Color, Urine COLORLESS (*)    Specific Gravity, Urine <1.005 (*)    All other components within normal limits  CBC WITH DIFFERENTIAL/PLATELET  MAGNESIUM  CK  TROPONIN I (HIGH SENSITIVITY)  TROPONIN I (HIGH SENSITIVITY)    EKG EKG Interpretation Date/Time:  Saturday May 09 2023 16:32:58 EDT Ventricular Rate:  59 PR Interval:  200 QRS Duration:  86 QT Interval:  420 QTC Calculation: 415 R Axis:   56  Text Interpretation: Sinus bradycardia ST & T wave abnormality, consider inferolateral ischemia Abnormal ECG No previous ECGs available agree. no STEMI Confirmed by Arby Barrette [phone]) on 05/09/2023 5:35:56 PM  Radiology DG Chest 2 View  Result Date: 05/09/2023 CLINICAL DATA:  Syncope EXAM: CHEST - 2 VIEW COMPARISON:  None Available. FINDINGS: The heart is mildly enlarged. There is no focal lung infiltrate, pleural effusion or pneumothorax. No acute fractures are seen. IMPRESSION: Mild cardiomegaly. No active cardiopulmonary disease. Electronically Signed   By: Darliss Cheney M.D.   On: 05/09/2023 19:19   DG Pelvis 1-2 Views  Result Date: 05/09/2023 CLINICAL DATA:  Fall EXAM: PELVIS - 1-2 VIEW COMPARISON:  None Available. FINDINGS: SI joints are non widened. Pubic symphysis and rami appear intact. No fracture or malalignment. Mild hip degenerative change IMPRESSION: No acute osseous abnormality  Electronically Signed   By: Jasmine Pang M.D.   On: 05/09/2023 19:18   CT Head Wo Contrast  Result Date: 05/09/2023 CLINICAL DATA:  Dizziness, syncope EXAM: CT HEAD WITHOUT CONTRAST CT CERVICAL SPINE WITHOUT CONTRAST TECHNIQUE: Multidetector CT imaging of the head and cervical spine was performed following the standard protocol without intravenous contrast. Multiplanar CT image reconstructions of the cervical spine were also generated. RADIATION DOSE REDUCTION: This exam was performed according to the departmental dose-optimization program which includes automated exposure control, adjustment of the mA and/or kV according to patient size and/or use of iterative reconstruction technique. COMPARISON:  None Available. FINDINGS: CT HEAD FINDINGS Brain: No evidence of acute infarction, hemorrhage, hydrocephalus, extra-axial collection or mass lesion/mass effect. Vascular: No hyperdense vessel or unexpected calcification. Skull: Normal. Negative for fracture or focal lesion. Sinuses/Orbits: No acute finding. Other: None. CT CERVICAL SPINE FINDINGS Alignment: Normal. Skull base and vertebrae: No acute fracture. No primary bone lesion or focal pathologic process. Soft tissues and spinal canal: No prevertebral fluid or swelling. No visible canal hematoma. Disc levels: Focally moderate disc space height loss and osteophytosis at C5-C6 with otherwise intact disc spaces. Upper chest: Negative. Other: None. IMPRESSION: 1. No acute intracranial pathology. 2. No fracture or static subluxation of the cervical spine. 3. Focally moderate disc space height loss and osteophytosis at C5-C6 with otherwise intact disc spaces. Electronically Signed   By: Jearld Lesch M.D.   On: 05/09/2023 19:14   CT Cervical Spine Wo Contrast  Result Date: 05/09/2023 CLINICAL DATA:  Dizziness, syncope EXAM: CT HEAD WITHOUT CONTRAST CT CERVICAL SPINE WITHOUT CONTRAST TECHNIQUE: Multidetector CT imaging of the head and cervical spine was  performed following the standard protocol without intravenous contrast. Multiplanar CT image reconstructions of the cervical spine were also generated. RADIATION DOSE REDUCTION: This exam was performed according to the departmental dose-optimization program which includes automated exposure control, adjustment of the mA and/or kV according to patient size and/or use of iterative reconstruction technique. COMPARISON:  None Available. FINDINGS: CT HEAD FINDINGS Brain: No evidence of acute infarction, hemorrhage, hydrocephalus, extra-axial collection or mass lesion/mass effect. Vascular: No hyperdense vessel or unexpected calcification. Skull: Normal. Negative for fracture or  focal lesion. Sinuses/Orbits: No acute finding. Other: None. CT CERVICAL SPINE FINDINGS Alignment: Normal. Skull base and vertebrae: No acute fracture. No primary bone lesion or focal pathologic process. Soft tissues and spinal canal: No prevertebral fluid or swelling. No visible canal hematoma. Disc levels: Focally moderate disc space height loss and osteophytosis at C5-C6 with otherwise intact disc spaces. Upper chest: Negative. Other: None. IMPRESSION: 1. No acute intracranial pathology. 2. No fracture or static subluxation of the cervical spine. 3. Focally moderate disc space height loss and osteophytosis at C5-C6 with otherwise intact disc spaces. Electronically Signed   By: Jearld Lesch M.D.   On: 05/09/2023 19:14    Procedures Procedures   Medications Ordered in ED Medications - No data to display  ED Course/ Medical Decision Making/ A&P Clinical Course as of 05/09/23 1951  Sat May 09, 2023  1857 FU on labs, imaging. Swimmy headed, passed out when got up. Feels better after meclizine. No thinners. No pain. Pfeiffer will see [BH]    Clinical Course User Index [BH] Henderly, Britni A, PA-C    Medical Decision Making Amount and/or Complexity of Data Reviewed Labs: ordered. Radiology: ordered.   88 y.o. female presents  to the ER for evaluation of syncopal episode. Differential diagnosis includes but is not limited to CVA, ACS, arrhythmia, vasovagal syncope, orthostatic hypotension, sepsis, hypoglycemia, electrolyte disturbance, respiratory failure, symptomatic anemia, dehydration, heat injury, polypharmacy, malignancy, anxiety/panic attack. Vital signs mildly elevated BP, otherwise unremarkable. Physical exam as noted above.   The patient took meclizine prior to arrival and reports a significant improvement of her symptoms. She reports that she felt "maybe a little" lightheadedness walking to the bathroom, but reports that she only had two pieces of toast today. She has a benign neurological exam. I discussed with my attending for possible MRI after CT. She does not think this is needed at this time, but will evaluate at bedside. Question possible admission for syncope.   I independently reviewed and interpreted the patient's labs. CBC without leukocytosis or anemia. Urinalysis shows dilute urine. Magnesium within normal limits. Troponin 7. CMP shows mildly elevated glucose at 146. BUN at 27. No other electrolyte or LFT abnormality. CK pending.  XR and CT imaging pending.   7:59 PM Care of JEZABELL SINYARD  transferred to PA Britni Henderly at the end of my shift as the patient will require reassessment once labs/imaging have resulted. Patient presentation, ED course, and plan of care discussed with review of all pertinent labs and imaging. Please see his/her note for further details regarding further ED course and disposition. Plan at time of handoff is follow up on labs and imaging. Pending admission versus discharge. Attending physician to evaluate. This may be altered or completely changed at the discretion of the oncoming team pending results of further workup.  I discussed this case with my attending physician who cosigned this note including patient's presenting symptoms, physical exam, and planned diagnostics  and interventions. Attending physician stated agreement with plan or made changes to plan which were implemented.   Attending physician assessed patient at bedside.  Portions of this report may have been transcribed using voice recognition software. Every effort was made to ensure accuracy; however, inadvertent computerized transcription errors may be present.   Final Clinical Impression(s) / ED Diagnoses Final diagnoses:  None    Rx / DC Orders ED Discharge Orders     None         Achille Rich, [place] 05/09/23 2022  Arby Barrette, MD 05/09/23 [phone]

## 2023-05-09 NOTE — ED Triage Notes (Signed)
"  Passed out this morning" Hit head  dizziness, blurry vision Dx flu and RSV on monday Seen at [facility] sent for eval Symptoms have improved some

## 2023-05-09 NOTE — Discharge Instructions (Signed)
1.  At this time your symptoms sound like vertigo which you have had previously.  At this time they are improved.  Observe closely for any changes or new symptoms.  Currently your CT scan of the head and lab work does not show any significant abnormality. 2.  Schedule a follow-up appointment with your doctor within the next 2 to 3 days for reassessment.  Return to the emergency department if you have any new concerning symptoms.  Your discharge instructions for vertigo and strokelike symptoms.

## 2023-05-28 ENCOUNTER — Other Ambulatory Visit: Payer: Self-pay

## 2023-05-28 ENCOUNTER — Ambulatory Visit: Payer: Medicare Other | Attending: Physician Assistant

## 2023-05-28 DIAGNOSIS — R42 Dizziness and giddiness: Secondary | ICD-10-CM | POA: Diagnosis present

## 2023-05-28 NOTE — Therapy (Deleted)
OUTPATIENT PHYSICAL THERAPY VESTIBULAR EVALUATION     Patient Name: Kelly Friedman MRN: [id number] DOB:1935-05-31, 88 y.o., female Today's Date: 05/28/2023  END OF SESSION:   Past Medical History:  Diagnosis Date   Anemia    Arthritis    Breast cancer (HCC)    Hypothyroidism    Pituitary adenoma (HCC) 10/05/2012   Pre-diabetes    Thyroid disease    Past Surgical History:  Procedure Laterality Date   APPENDECTOMY     BREAST BIOPSY Right 01/04/2021   BREAST LUMPECTOMY Right 02/05/2021   BREAST LUMPECTOMY WITH RADIOACTIVE SEED LOCALIZATION Right 02/05/2021   Procedure: RIGHT BREAST LUMPECTOMY WITH RADIOACTIVE SEED LOCALIZATION;  Surgeon: Almond Lint, MD;  Location: [facility] OR;  Service: General;  Laterality: Right;   CATARACT EXTRACTION     DILATION AND CURETTAGE OF UTERUS     EYE SURGERY     HAMMER TOE SURGERY Right    mole removed  2013 and 2006   TUBAL LIGATION     Patient Active Problem List   Diagnosis Date Noted   Labial lesion 07/30/2021   Breast cancer in situ 02/12/2021   Genetic testing 01/30/2021   Family history of cancer 01/16/2021   Malignant neoplasm of upper-outer quadrant of right breast in female, estrogen receptor positive (HCC) 01/16/2021   Ductal carcinoma in situ (DCIS) of right breast 01/10/2021   Rheumatoid arthritis involving right ankle (HCC) 09/11/2020   Posterior tibial tendinitis of right lower extremity 06/14/2020   Hammer toe of right foot 10/30/2015   Facial pain 10/05/2012   Pituitary adenoma (HCC) 10/05/2012   Glaucoma 03/15/2003    PCP: *** REFERRING PROVIDER: ***  REFERRING DIAG: ***  THERAPY DIAG:  No diagnosis found.  ONSET DATE: ***  Rationale for Evaluation and Treatment: {HABREHAB:27488}  SUBJECTIVE:   SUBJECTIVE STATEMENT: *** Pt accompanied by: {accompnied:27141}  PERTINENT HISTORY: ***  PAIN:  Are you having pain? {OPRCPAIN:27236}  PRECAUTIONS: {Therapy precautions:24002}  RED FLAGS: {PT Red  Flags:29287}   WEIGHT BEARING RESTRICTIONS: {Yes ***/No:24003}  FALLS: Has patient fallen in last 6 months? {fallsyesno:27318}  LIVING ENVIRONMENT: Lives with: {OPRC lives with:25569::"lives with their family"} Lives in: {Lives in:25570} Stairs: {opstairs:27293} Has following equipment at home: {Assistive devices:23999}  PLOF: {PLOF:24004}  PATIENT GOALS: ***  OBJECTIVE:  Note: Objective measures were completed at Evaluation unless otherwise noted.  DIAGNOSTIC FINDINGS: ***  COGNITION: Overall cognitive status: {cognition:24006}   SENSATION: {sensation:27233}  EDEMA:  {edema:24020}  MUSCLE TONE:  {LE tone:25568}  DTRs:  {DTR SITE:24025}  POSTURE:  {posture:25561}  Cervical ROM:    {AROM/PROM:27142} A/PROM (deg) eval  Flexion   Extension   Right lateral flexion   Left lateral flexion   Right rotation   Left rotation   (Blank rows = not tested)  STRENGTH: ***  LOWER EXTREMITY MMT:   MMT Right eval Left eval  Hip flexion    Hip abduction    Hip adduction    Hip internal rotation    Hip external rotation    Knee flexion    Knee extension    Ankle dorsiflexion    Ankle plantarflexion    Ankle inversion    Ankle eversion    (Blank rows = not tested)  BED MOBILITY:  {Bed mobility:24027}  TRANSFERS: Assistive device utilized: {Assistive devices:23999}  Sit to stand: {Levels of assistance:24026} Stand to sit: {Levels of assistance:24026} Chair to chair: {Levels of assistance:24026} Floor: {Levels of assistance:24026}  RAMP: {Levels of assistance:24026}  CURB: {Levels of assistance:24026}  GAIT: Gait  pattern: {gait characteristics:25376} Distance walked: *** Assistive device utilized: {Assistive devices:23999} Level of assistance: {Levels of assistance:24026} Comments: ***  FUNCTIONAL TESTS:  {Functional tests:24029}  PATIENT SURVEYS:  {rehab surveys:24030}  VESTIBULAR ASSESSMENT:  GENERAL OBSERVATION: ***   SYMPTOM  BEHAVIOR:  Subjective history: ***  Non-Vestibular symptoms: {nonvestibular symptoms:25260}  Type of dizziness: {Type of Dizziness:25255}  Frequency: ***  Duration: ***  Aggravating factors: {Aggravating Factors:25258}  Relieving factors: {Relieving Factors:25259}  Progression of symptoms: {DESC; BETTER/WORSE:18575}  OCULOMOTOR EXAM:  Ocular Alignment: {Ocular Alignment:25262}  Ocular ROM: {RANGE OF MOTION:21649}  Spontaneous Nystagmus: {Spontaneous nystagmus:25263}  Gaze-Induced Nystagmus: {gaze-induced nystagmus:25264}  Smooth Pursuits: {smooth pursuit:25265}  Saccades: {saccades:25266}  Convergence/Divergence: *** cm   FRENZEL - FIXATION SUPRESSED:  Ocular Alignment: {Ocular Alignment:25262}  Spontaneous Nystagmus: {Spontaneous nystagmus:25263}  Gaze-Induced Nystagmus: {gaze-induced nystagmus:25264}  Horizontal head shaking - induced nystagmus: {head shaking induced nystagmus:25267}  Vertical head shaking - induced nystagmus: {head shaking induced nystagmus:25267}  Positional tests: {Positional tests:25271}  Pressure tests: {frenzel pressure tests:25268}  VESTIBULAR - OCULAR REFLEX:   Slow VOR: {slow VOR:25290}  VOR Cancellation: {vor cancellation:25291}  Head-Impulse Test: {head impulse test:25272}  Dynamic Visual Acuity: {dynamic visual acuity:25273}   POSITIONAL TESTING: {Positional tests:25271}  MOTION SENSITIVITY:  Motion Sensitivity Quotient Intensity: 0 = none, 1 = Lightheaded, 2 = Mild, 3 = Moderate, 4 = Severe, 5 = Vomiting  Intensity  1. Sitting to supine   2. Supine to L side   3. Supine to R side   4. Supine to sitting   5. L Hallpike-Dix   6. Up from L    7. R Hallpike-Dix   8. Up from R    9. Sitting, head tipped to L knee   10. Head up from L knee   11. Sitting, head tipped to R knee   12. Head up from R knee   13. Sitting head turns x5   14.Sitting head nods x5   15. In stance, 180 turn to L    16. In stance, 180 turn to R     OTHOSTATICS:  {Exam; orthostatics:31331}  FUNCTIONAL GAIT: {Functional tests:24029}   VESTIBULAR TREATMENT:                                                                                                   DATE: ***  Canalith Repositioning:  {Canalith Repositioning:25283} Gaze Adaptation:  {gaze adaptation:25286} Habituation:  {habituation:25288} Other: ***  PATIENT EDUCATION: Education details: *** Person educated: {Person educated:25204} Education method: {Education Method:25205} Education comprehension: {Education Comprehension:25206}  HOME EXERCISE PROGRAM:  GOALS: Goals reviewed with patient? {yes/no:20286}  SHORT TERM GOALS: Target date: ***  *** Baseline: Goal status: {GOALSTATUS:25110}  2.  *** Baseline:  Goal status: {GOALSTATUS:25110}  3.  *** Baseline:  Goal status: {GOALSTATUS:25110}  4.  *** Baseline:  Goal status: {GOALSTATUS:25110}  5.  *** Baseline:  Goal status: {GOALSTATUS:25110}  6.  *** Baseline:  Goal status: {GOALSTATUS:25110}  LONG TERM GOALS: Target date: ***  *** Baseline:  Goal status: {GOALSTATUS:25110}  2.  *** Baseline:  Goal status: {GOALSTATUS:25110}  3.  *** Baseline:  Goal status: {  GOALSTATUS:25110}  4.  *** Baseline:  Goal status: {GOALSTATUS:25110}  5.  *** Baseline:  Goal status: {GOALSTATUS:25110}  6.  *** Baseline:  Goal status: {GOALSTATUS:25110}  ASSESSMENT:  CLINICAL IMPRESSION: Patient is a *** y.o. *** who was seen today for physical therapy evaluation and treatment for ***.   OBJECTIVE IMPAIRMENTS: {opptimpairments:25111}.   ACTIVITY LIMITATIONS: {activitylimitations:27494}  PARTICIPATION LIMITATIONS: {participationrestrictions:25113}  PERSONAL FACTORS: {Personal factors:25162} are also affecting patient's functional outcome.   REHAB POTENTIAL: {rehabpotential:25112}  CLINICAL DECISION MAKING: {clinical decision making:25114}  EVALUATION COMPLEXITY: {Evaluation  complexity:25115}   PLAN:  PT FREQUENCY: {rehab frequency:25116}  PT DURATION: {rehab duration:25117}  PLANNED INTERVENTIONS: {rehab planned interventions:25118::"97110-Therapeutic exercises","97530- Therapeutic [phone]- Neuromuscular re-education","97535- Self GUYQ","03474- Manual therapy"}  PLAN FOR NEXT SESSION: ***   Kiven Vangilder L Lashane Whelpley, PT 05/28/2023, 5:50 PM

## 2023-05-28 NOTE — Therapy (Signed)
OUTPATIENT PHYSICAL THERAPY VESTIBULAR EVALUATION     Patient Name: Kelly Friedman MRN: [id number] DOB:[birth year], 88 y.o., female Today's Date: 05/28/2023  END OF SESSION:  PT End of Session - 05/28/23 1750     Visit Number 1    PT Start Time 1530    PT Stop Time 1615    PT Time Calculation (min) 45 min    Activity Tolerance Patient tolerated treatment well    Behavior During Therapy [facility] for tasks assessed/performed             Past Medical History:  Diagnosis Date   Anemia    Arthritis    Breast cancer (HCC)    Hypothyroidism    Pituitary adenoma (HCC) 10/05/2012   Pre-diabetes    Thyroid disease    Past Surgical History:  Procedure Laterality Date   APPENDECTOMY     BREAST BIOPSY Right 01/04/2021   BREAST LUMPECTOMY Right 02/05/2021   BREAST LUMPECTOMY WITH RADIOACTIVE SEED LOCALIZATION Right 02/05/2021   Procedure: RIGHT BREAST LUMPECTOMY WITH RADIOACTIVE SEED LOCALIZATION;  Surgeon: Almond Lint, MD;  Location: [facility] OR;  Service: General;  Laterality: Right;   CATARACT EXTRACTION     DILATION AND CURETTAGE OF UTERUS     EYE SURGERY     HAMMER TOE SURGERY Right    mole removed  2013 and 2006   TUBAL LIGATION     Patient Active Problem List   Diagnosis Date Noted   Labial lesion 07/30/2021   Breast cancer in situ 02/12/2021   Genetic testing 01/30/2021   Family history of cancer 01/16/2021   Malignant neoplasm of upper-outer quadrant of right breast in female, estrogen receptor positive (HCC) 01/16/2021   Ductal carcinoma in situ (DCIS) of right breast 01/10/2021   Rheumatoid arthritis involving right ankle (HCC) 09/11/2020   Posterior tibial tendinitis of right lower extremity 06/14/2020   Hammer toe of right foot 10/30/2015   Facial pain 10/05/2012   Pituitary adenoma (HCC) 10/05/2012   Glaucoma 03/15/2003    PCP: Johny Blamer REFERRING PROVIDER: Sanda Klein, P  REFERRING DIAG: dizziness, BPPV  THERAPY DIAG:  Dizziness and  giddiness  ONSET DATE: 05/09/23  Rationale for Evaluation and Treatment: Rehabilitation  SUBJECTIVE:   SUBJECTIVE STATEMENT: Reports since incident in which she was in the ER, she has not had episodes of room moving or her eyes jumping, but is experiencing low grade underlying dizziness. Pt accompanied by: self  PERTINENT HISTORY: had syncopal episode, and hospital ED visit, with vertigo was dc'd, taking meclizine now prn.  Has resumed her water aerobics classes, no other problems  PAIN:  Are you having pain? No  PRECAUTIONS: None  RED FLAGS: None   WEIGHT BEARING RESTRICTIONS: No  FALLS: Has patient fallen in last 6 months? Yes. Number of falls one syncopal episode  LIVING ENVIRONMENT: Lives with: lives with their family and lives alone Lives in: House/apartment Stairs: a few outdoors Has following equipment at home:   PLOF: Independent  PATIENT GOALS: prevent vertigo happening again   OBJECTIVE:  Note: Objective measures were completed at Evaluation unless otherwise noted.  DIAGNOSTIC FINDINGS: in ER on 10/26 underwent CT scans, no remarkable findings  COGNITION: Overall cognitive status: Within functional limits for tasks assessed   SENSATION: WFL  EDEMA:  none   Cervical ROM:    Active A/PROM (deg) eval  Flexion 70  Extension   Right lateral flexion   Left lateral flexion   Right rotation 80  Left rotation 70  (Blank  rows = not tested)  STRENGTH: wfl Ue's and LE's  BED MOBILITY:  All wnl, no dizziness provoked  TRANSFERS: all I and wnl  GAIT: I no deficits noted    PATIENT SURVEYS:    VESTIBULAR ASSESSMENT:  GENERAL OBSERVATION: healthy, alert, normal posture, normal movement patterns unguarded bed mobility, gait, transfers   SYMPTOM BEHAVIOR:  Subjective history: see above  Non-Vestibular symptoms: loss of consciousness  Type of dizziness: Spinning/Vertigo  Frequency: once Oct 26  Duration: that day  Aggravating factors:  Spontaneous  Relieving factors: head stationary and medication  Progression of symptoms: better  OCULOMOTOR EXAM:  Ocular Alignment: normal  Ocular ROM: No Limitations  Spontaneous Nystagmus: absent  Gaze-Induced Nystagmus: absent  Smooth Pursuits: intact  Saccades: intact  Convergence/Divergence: NA     VESTIBULAR - OCULAR REFLEX:   Slow VOR: Normal  VOR Cancellation: Normal  Head-Impulse Test: nt  Dynamic Visual Acuity: Dynamic: wnl, one line difference with eye chart   POSITIONAL TESTING: Right Dix-Hallpike: no nystagmus Left Dix-Hallpike: no nystagmus Right Roll Test: no nystagmus Left Roll Test: no nystagmus  MOTION SENSITIVITY:  Motion Sensitivity Quotient Intensity: 0 = none, 1 = Lightheaded, 2 = Mild, 3 = Moderate, 4 = Severe, 5 = Vomiting  Intensity  1. Sitting to supine 0  2. Supine to L side 0  3. Supine to R side 0  4. Supine to sitting 0  5. L Hallpike-Dix 0  6. Up from L  0  7. R Hallpike-Dix 0  8. Up from R  0  9. Sitting, head tipped to L knee 0  10. Head up from L knee 0  11. Sitting, head tipped to R knee 0  12. Head up from R knee 0  13. Sitting head turns x5 0  14.Sitting head nods x5 0  15. In stance, 180 turn to L  nt  16. In stance, 180 turn to R nt    OTHOSTATICS: not done   VESTIBULAR TREATMENT:                                                                                                   DATE: 05/28/23 Eval, no reproducible Sx no Rx needed    PATIENT EDUCATION: Education details: eval findings   HOME EXERCISE PROGRAM:  GOALS: Goals reviewed with patient? No NA  ASSESSMENT:  CLINICAL IMPRESSION: Patient is a 88 y.o. female who was evaluated today by physical therapy to address an episode of vertigo with syncope which occurred 2 1/2 weeks ago.  Since that time she reports an underlying feeling of dizziness.  Today she scored normally on all testing, we were unable to reproduce her Sx in any way.  No further PT  recommended. OBJECTIVE IMPAIRMENTS:  na.   ACTIVITY LIMITATIONS: na  PARTICIPATION LIMITATIONS: na  PERSONAL FACTORS: Age, Behavior pattern, and Past/current experiences are also affecting patient's functional outcome.   REHAB POTENTIAL: Excellent  CLINICAL DECISION MAKING: Stable/uncomplicated  EVALUATION COMPLEXITY: Low   PLAN:  PT FREQUENCY: one time visit  PT DURATION: other: na one time visit  PLANNED INTERVENTIONS: na  PLAN FOR NEXT SESSION: evaluation only, no further skilled PT needed for this Dx at this time.   Esteban Kobashigawa L Shekelia Boutin, PT,DPT, OCS 05/28/2023, 5:53 PM

## 2023-11-04 ENCOUNTER — Other Ambulatory Visit: Payer: Self-pay | Admitting: *Deleted

## 2023-11-04 MED ORDER — ANASTROZOLE 1 MG PO TABS: 1.0000 mg | ORAL_TABLET | Freq: Every day | ORAL | 3 refills | Status: AC

## 2023-12-17 ENCOUNTER — Ambulatory Visit

## 2023-12-17 ENCOUNTER — Ambulatory Visit
Admission: RE | Admit: 2023-12-17 | Discharge: 2023-12-17 | Disposition: A | Discharge status: Home / Self Care | Source: Ambulatory Visit | Attending: Hematology and Oncology

## 2023-12-17 DIAGNOSIS — Z1231 Encounter for screening mammogram for malignant neoplasm of breast: Secondary | ICD-10-CM

## 2023-12-17 DIAGNOSIS — D0511 Intraductal carcinoma in situ of right breast: Secondary | ICD-10-CM

## 2024-01-18 ENCOUNTER — Telehealth: Payer: Self-pay | Admitting: Hematology and Oncology

## 2024-01-18 NOTE — Telephone Encounter (Signed)
 Rescheule appointment per provider pal.  Called left VM with changes made to the upcoming appointment.

## 2024-01-25 ENCOUNTER — Inpatient Hospital Stay: Payer: Medicare Other | Admitting: Hematology and Oncology

## 2024-02-04 ENCOUNTER — Telehealth: Payer: Self-pay | Admitting: *Deleted

## 2024-02-04 NOTE — Telephone Encounter (Signed)
 Confirmed appt with pt for 02/05/24.

## 2024-02-05 ENCOUNTER — Inpatient Hospital Stay: Attending: Hematology and Oncology | Admitting: Hematology and Oncology

## 2024-02-05 VITALS — BP 125/52 | HR 73 | Temp 98.4°F | Resp 18 | Ht 64.0 in | Wt 161.6 lb

## 2024-02-05 DIAGNOSIS — Z13820 Encounter for screening for osteoporosis: Secondary | ICD-10-CM

## 2024-02-05 DIAGNOSIS — Z170 Estrogen receptor positive status [ER+]: Secondary | ICD-10-CM | POA: Diagnosis not present

## 2024-02-05 DIAGNOSIS — Z801 Family history of malignant neoplasm of trachea, bronchus and lung: Secondary | ICD-10-CM | POA: Insufficient documentation

## 2024-02-05 DIAGNOSIS — Z800 Family history of malignant neoplasm of digestive organs: Secondary | ICD-10-CM | POA: Diagnosis not present

## 2024-02-05 DIAGNOSIS — Z803 Family history of malignant neoplasm of breast: Secondary | ICD-10-CM | POA: Diagnosis not present

## 2024-02-05 DIAGNOSIS — Z1231 Encounter for screening mammogram for malignant neoplasm of breast: Secondary | ICD-10-CM | POA: Diagnosis not present

## 2024-02-05 DIAGNOSIS — M8580 Other specified disorders of bone density and structure, unspecified site: Secondary | ICD-10-CM | POA: Diagnosis not present

## 2024-02-05 DIAGNOSIS — Z1721 Progesterone receptor positive status: Secondary | ICD-10-CM | POA: Diagnosis not present

## 2024-02-05 DIAGNOSIS — Z780 Asymptomatic menopausal state: Secondary | ICD-10-CM

## 2024-02-05 DIAGNOSIS — Z8042 Family history of malignant neoplasm of prostate: Secondary | ICD-10-CM | POA: Insufficient documentation

## 2024-02-05 DIAGNOSIS — Z79811 Long term (current) use of aromatase inhibitors: Secondary | ICD-10-CM | POA: Insufficient documentation

## 2024-02-05 DIAGNOSIS — D0511 Intraductal carcinoma in situ of right breast: Secondary | ICD-10-CM | POA: Diagnosis not present

## 2024-02-05 NOTE — Progress Notes (Signed)
 [facility]  [phone] [phone]     ID: Kelly Friedman DOB: 02-Apr-1935  MR#: [id number]  RDW#:[id number]  Patient Care Team: Arloa Elsie SAUNDERS, MD as PCP - General (Family Medicine) Tyree Nanetta SAILOR, RN as Oncology Nurse Navigator Glean, Stephane BROCKS, RN (Inactive) as Oncology Nurse Navigator Magrinat, Sandria BROCKS, MD (Inactive) as Consulting Physician (Oncology) Aron Shoulders, MD as Consulting Physician (General Surgery) Dewey Rush, MD as Consulting Physician (Radiation Oncology) Latisha Medford, MD as Consulting Physician (Obstetrics and Gynecology) Dial, Dekarlos M, DPM as Referring Physician (Podiatry) Marcey Elspeth PARAS, MD as Consulting Physician (Ophthalmology) Beauty Alm PARAS, MD as Referring Physician (Orthopedic Surgery) Court Pulling, MD as Referring Physician (Dermatology) Amber Stalls, MD OTHER MD:  CHIEF COMPLAINT: Estrogen receptor positive noninvasive breast cancer  CURRENT TREATMENT: anastrozole   INTERVAL HISTORY:  Kelly Friedman returns today for follow up of her noninvasive breast cancer. Discussed the use of AI scribe software for clinical note transcription with the patient, who gave verbal consent to proceed.  History of Present Illness Kelly Friedman is an 89 year old female with breast cancer who presents for a routine follow-up.  She is currently on anastrozole  for breast cancer and experiences mild hot flashes as a side effect. No other side effects are reported. She is scheduled to complete the anastrozole  treatment in July 2027.  She recently had a mammogram, which was normal.  She engages in regular physical activity, attending aerobics classes at a sports center approximately four out of five days a week. She also attempts to walk regularly, although she feels she could do more.  She reports a slight weight loss recently.   COVID 19 VACCINATION STATUS: Pfizer x3   HISTORY OF CURRENT ILLNESS: From the  original intake note:  Kelly Friedman had routine screening mammography on 12/11/2020 showing a possible abnormality in the right breast. She underwent right diagnostic mammography with tomography and right breast ultrasonography at [facility] on 01/03/2021 showing: breast density category C; persistent focal distortion within superior right breast without sonographic correlate.  Accordingly on 01/04/2021 she proceeded to biopsy of the right breast area in question. The pathology from this procedure ([id number]) showed: ductal carcinoma in situ with calcifications involving a complex sclerosing lesion. Prognostic indicators significant for: estrogen receptor, 95% positive and progesterone receptor, 1% positive, both with strong staining intensity.    Cancer Staging  Ductal carcinoma in situ (DCIS) of right breast Staging form: Breast, AJCC 8th Edition - Clinical stage from 01/16/2021: Stage 0 (cTis (DCIS), cN0, cM0, ER+, PR+) - Signed by Layla Sandria BROCKS, MD on 01/16/2021 Stage prefix: Initial diagnosis Nuclear grade: G3   The patient's subsequent history is as detailed below.   PAST MEDICAL HISTORY: Past Medical History:  Diagnosis Date   Anemia    Arthritis    Breast cancer (HCC)    Hypothyroidism    Pituitary adenoma (HCC) 10/05/2012   Pre-diabetes    Thyroid disease     PAST SURGICAL HISTORY: Past Surgical History:  Procedure Laterality Date   APPENDECTOMY     BREAST BIOPSY Right 01/04/2021   BREAST LUMPECTOMY Right 02/05/2021   BREAST LUMPECTOMY WITH RADIOACTIVE SEED LOCALIZATION Right 02/05/2021   Procedure: RIGHT BREAST LUMPECTOMY WITH RADIOACTIVE SEED LOCALIZATION;  Surgeon: Aron Shoulders, MD;  Location: [facility] OR;  Service: General;  Laterality: Right;   CATARACT EXTRACTION     DILATION AND CURETTAGE OF UTERUS     EYE SURGERY     HAMMER TOE SURGERY Right  mole removed  2013 and 2006   TUBAL LIGATION      FAMILY HISTORY: Family History  Problem Relation Age  of Onset   Breast cancer Sister    Pancreatic cancer Sister    Breast cancer Sister    Lung cancer Sister    Prostate cancer Brother    Stomach cancer Paternal Grandmother    Her father died at age 7 from cirrhosis (alcohol-induced). Her mother died at age 48. Tya has two brothers and four sisters. She reports breast cancer in two sisters, one at age 34, who was also diagnosed with pancreatic cancer, and the other at age 72. She also reports lung cancer in a sister at age 58, prostate cancer in a brother at age 89, and stomach cancer in her paternal grandmother.   GYNECOLOGIC HISTORY:  No LMP recorded. Patient is postmenopausal. Menarche: 89 years old Age at first live birth: 89 years old GX P 2 LMP 1995 Contraceptive never used HRT never used  Hysterectomy? no BSO? 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 Husband Kelly Friedman was a retired Merchandiser, retail.  He died from pneumonia at home in 2022. Daughter Kelly Friedman, age 98, is a group Herbalist here in [place]. Son Kelly Friedman, age 47, is a Hydrographic surveyor in [place]. Kelly Friedman has three grandchildren, aged 10, 33, and 69. She has no great-grandchildren. She attends a Psychologist, counselling church.    ADVANCED DIRECTIVES: To be determined   HEALTH MAINTENANCE: Social History   Tobacco Use   Smoking status: Never   Smokeless tobacco: Never  Vaping Use   Vaping status: Never Used  Substance Use Topics   Alcohol use: Yes    Comment: 1 month   Drug use: No     Colonoscopy: 2012 (with [facility])  PAP: 2017  Bone density: 12/2019   Allergies  Allergen Reactions   Peanut-Containing Drug Products Other (See Comments)    Current Outpatient Medications  Medication Sig Dispense Refill   anastrozole  (ARIMIDEX ) 1 MG tablet Take 1 tablet (1 mg total) by mouth daily. 90 tablet 3   Ascorbic Acid (VITAMIN C) 1000 MG tablet Take 1,000 mg by mouth in the  morning.     Biotin 89999 MCG TBDP Take 10,000 mcg by mouth in the morning.     brimonidine  (ALPHAGAN ) 0.2 % ophthalmic solution Place 1 drop into both eyes in the morning and at bedtime.     Cholecalciferol (VITAMIN D3) 50 MCG (2000 UT) TABS Take 2,000 Units by mouth in the morning. 90 tablet 4   ELDERBERRY PO Take 2 tablets by mouth in the morning. Elderberry Gummies     fluticasone (FLONASE) 50 MCG/ACT nasal spray Place 1 spray into both nostrils daily as needed for allergies or rhinitis.     levothyroxine  (SYNTHROID) 25 MCG tablet Take 25 mcg by mouth every morning.     meclizine  (ANTIVERT ) 25 MG tablet Take 25 mg by mouth 3 (three) times daily as needed (vertigo).     Multiple Vitamin (MULTIVITAMIN WITH MINERALS) TABS tablet Take 1 tablet by mouth daily. Women's Multivitamin     psyllium (METAMUCIL SMOOTH TEXTURE) 28 % packet Take 1 packet by mouth daily as needed (digestive regularity).     Travoprost , BAK Free, (TRAVATAN ) 0.004 % SOLN ophthalmic solution Place 1 drop into both eyes at bedtime.     Zinc 50 MG TABS Take 50 mg by mouth in the morning.  No current facility-administered medications for this visit.    OBJECTIVE: African-American woman who appears younger than stated age  There were no vitals filed for this visit.    There is no height or weight on file to calculate BMI.   Wt Readings from Last 3 Encounters:  01/12/23 164 lb 14.4 oz (74.8 kg)  08/12/22 165 lb 6.4 oz (75 kg)  07/01/22 165 lb 14.4 oz (75.3 kg)      ECOG FS:1 - Symptomatic but completely ambulatory  Physical Exam Constitutional:      Appearance: Normal appearance.  Chest:       Comments: Bilateral breasts inspected.  No palpable masses or regional adenopathy. Scattered density noted mostly in left upper outer quadrant. Musculoskeletal:     Cervical back: Normal range of motion and neck supple. No rigidity.  Lymphadenopathy:     Cervical: No cervical adenopathy.  Neurological:     Mental  Status: She is alert.       LAB RESULTS:  CMP     Component Value Date/Time   NA 137 05/09/2023 1652   K 4.5 05/09/2023 1652   CL 103 05/09/2023 1652   CO2 29 05/09/2023 1652   GLUCOSE 146 (H) 05/09/2023 1652   BUN 27 (H) 05/09/2023 1652   CREATININE 0.92 05/09/2023 1652   CREATININE 0.90 01/16/2021 0819   CALCIUM 9.8 05/09/2023 1652   PROT 7.3 05/09/2023 1652   ALBUMIN 4.2 05/09/2023 1652   AST 27 05/09/2023 1652   AST 20 01/16/2021 0819   ALT 22 05/09/2023 1652   ALT 18 01/16/2021 0819   ALKPHOS 75 05/09/2023 1652   BILITOT 0.5 05/09/2023 1652   BILITOT 0.3 01/16/2021 0819   GFRNONAA >60 05/09/2023 1652   GFRNONAA >60 01/16/2021 0819    No results found for: TOTALPROTELP, ALBUMINELP, A1GS, A2GS, BETS, BETA2SER, GAMS, MSPIKE, SPEI  Lab Results  Component Value Date   WBC 6.1 05/09/2023   NEUTROABS 4.1 05/09/2023   HGB 12.9 05/09/2023   HCT 39.9 05/09/2023   MCV 88.9 05/09/2023   PLT 151 05/09/2023    No results found for: LABCA2  No components found for: OJARJW874  No results for input(s): INR in the last 168 hours.  No results found for: LABCA2  No results found for: RJW800  No results found for: CAN125  No results found for: CAN153  No results found for: CA2729  No components found for: HGQUANT  No results found for: CEA1, CEA / No results found for: CEA1, CEA   No results found for: AFPTUMOR  No results found for: CHROMOGRNA  No results found for: KPAFRELGTCHN, LAMBDASER, KAPLAMBRATIO (kappa/lambda light chains)  No results found for: HGBA, HGBA2QUANT, HGBFQUANT, HGBSQUAN (Hemoglobinopathy evaluation)   No results found for: LDH  No results found for: IRON, TIBC, IRONPCTSAT (Iron and TIBC)  No results found for: FERRITIN  Urinalysis    Component Value Date/Time   COLORURINE COLORLESS (A) 05/09/2023 1718   APPEARANCEUR CLEAR 05/09/2023 1718   LABSPEC <1.005  (L) 05/09/2023 1718   PHURINE 6.5 05/09/2023 1718   GLUCOSEU NEGATIVE 05/09/2023 1718   HGBUR NEGATIVE 05/09/2023 1718   BILIRUBINUR NEGATIVE 05/09/2023 1718   KETONESUR NEGATIVE 05/09/2023 1718   PROTEINUR NEGATIVE 05/09/2023 1718   NITRITE NEGATIVE 05/09/2023 1718   LEUKOCYTESUR NEGATIVE 05/09/2023 1718     STUDIES: No results found.   ELIGIBLE FOR AVAILABLE RESEARCH PROTOCOL: no  ASSESSMENT: 89 y.o. Kelly Friedman woman status post right breast biopsy 01/04/2021 for ductal carcinoma in situ,  estrogen and progesterone receptor positive  (1) genetics testing (A) Negative hereditary cancer genetic testing: no pathogenic variants detected in [facility] Panel.  Variants of uncertain significance detected in AXIN2 at c.1235A>G (p.Asn412Ser) and RNF43 at c.2054C>A (p.Thr685Asn).  The report date is February 10, 2019.    The [facility] gene panel offered by [employer] includes sequencing and deletion/duplication testing of the following 35 genes: APC, ATM, AXIN2, BARD1, BMPR1A, BRCA1, BRCA2, BRIP1, CHD1, CDK4, CDKN2A, CHEK2, EPCAM (large rearrangement only), HOXB13, (sequencing only), GALNT12, MLH1, MSH2, MSH3 (excluding repetitive portions of exon 1), MSH6, MUTYH, NBN, NTHL1, PALB2, PMS2, PTEN, RAD51C, RAD51D, RNF43, RPS20, SMAD4, STK11, and TP53. Sequencing was performed for select regions of POLE and POLD1, and large rearrangement analysis was performed for select regions of GREM1.  (2) status post right lumpectomy 02/05/2021 for ductal carcinoma in situ measuring 1.6 cm, grade 2, with negative margins.  (3) anastrozole  started 01/17/2021   PLAN:  [name] is here for follow-up on anastrozole .  She will complete this in July 2027.  Assessment and Plan Assessment & Plan Breast cancer Breast cancer under treatment with anastrozole . Mild side effects reported. Recent mammogram showed no recurrence. - Continue anastrozole  therapy. - Perform monthly self-breast exams. -  Schedule follow-up appointment for next year.  Mild osteopenia Mild osteopenia noted in 2023. Active lifestyle maintained. - Order bone density scan.    RTC with me in June or July for follow up.  Total time: 30 min *Total Encounter Time as defined by the Centers for Medicare and Medicaid Services includes, in addition to the face-to-face time of a patient visit (documented in the note above) non-face-to-face time: obtaining and reviewing outside history, ordering and reviewing medications, tests or procedures, care coordination (communications with other health care professionals or caregivers) and documentation in the medical record.

## 2024-03-28 ENCOUNTER — Other Ambulatory Visit: Payer: Self-pay | Admitting: Adult Health

## 2024-03-28 DIAGNOSIS — Z13820 Encounter for screening for osteoporosis: Secondary | ICD-10-CM

## 2024-03-28 DIAGNOSIS — Z79811 Long term (current) use of aromatase inhibitors: Secondary | ICD-10-CM

## 2024-03-29 ENCOUNTER — Telehealth: Payer: Self-pay

## 2024-03-29 NOTE — Telephone Encounter (Addendum)
 Called patient to relay message below as per Morna Kendall NP, patient stated she had a bone density test done on 09/08 at [facility], I called the office and had a copy faxed to us  for Morna and will place a copy in her records. Patient had no further questions at this time. Phone: [phone] [address] [place] [address]  ----- Message from Morna JAYSON Kendall sent at 03/28/2024  3:31 PM EDT ----- Please let patient know that bone density testing is no longer taking place at [facility] imaging.  I placed orders for her to undergo testing at [facility].  It may take a few months for her to undergo testing.  They should reach out to schedule her, but she can also call them at [phone].

## 2024-04-11 ENCOUNTER — Other Ambulatory Visit (HOSPITAL_COMMUNITY): Payer: Self-pay | Admitting: Family Medicine

## 2024-04-11 DIAGNOSIS — R9431 Abnormal electrocardiogram [ECG] [EKG]: Secondary | ICD-10-CM

## 2024-04-12 ENCOUNTER — Ambulatory Visit (HOSPITAL_COMMUNITY)
Admission: RE | Admit: 2024-04-12 | Discharge: 2024-04-12 | Disposition: A | Discharge status: Home / Self Care | Source: Ambulatory Visit | Attending: Family Medicine | Admitting: Family Medicine

## 2024-04-12 DIAGNOSIS — R9431 Abnormal electrocardiogram [ECG] [EKG]: Secondary | ICD-10-CM | POA: Insufficient documentation

## 2024-04-12 LAB — ECHOCARDIOGRAM COMPLETE
Area-P 1/2: 4.49 cm2
P 1/2 time: 347 ms
S' Lateral: 2.5 cm

## 2024-06-29 ENCOUNTER — Telehealth: Payer: Self-pay | Admitting: Hematology and Oncology

## 2024-06-29 NOTE — Telephone Encounter (Signed)
 I spoke to pt regarding 02/06/25 appt being rescheduled to 02/07/25. Patient is aware of new appt date and time.

## 2024-08-02 NOTE — Telephone Encounter (Signed)
 SABRA

## 2025-02-06 ENCOUNTER — Ambulatory Visit: Admitting: Hematology and Oncology

## 2025-02-07 ENCOUNTER — Inpatient Hospital Stay: Admitting: Hematology and Oncology
